# Patient Record
Sex: Female | Born: 1935 | Race: White | Hispanic: No | Smoking: Never smoker
Health system: Southern US, Community
[De-identification: ages and names within clinical notes are randomized; demographics above are authoritative.]

## PROBLEM LIST (undated history)

## (undated) DIAGNOSIS — Z9889 Other specified postprocedural states: Secondary | ICD-10-CM

## (undated) DIAGNOSIS — M810 Age-related osteoporosis without current pathological fracture: Secondary | ICD-10-CM

## (undated) DIAGNOSIS — E559 Vitamin D deficiency, unspecified: Secondary | ICD-10-CM

## (undated) HISTORY — PX: APPENDECTOMY: SHX54

---
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
## 2004-04-22 ENCOUNTER — Ambulatory Visit: Payer: Self-pay | Admitting: Unknown Physician Specialty

## 2005-04-29 ENCOUNTER — Ambulatory Visit: Payer: Self-pay | Admitting: Unknown Physician Specialty

## 2006-05-12 ENCOUNTER — Ambulatory Visit: Payer: Self-pay | Admitting: Unknown Physician Specialty

## 2006-09-26 ENCOUNTER — Ambulatory Visit: Payer: Self-pay | Admitting: Ophthalmology

## 2007-01-30 ENCOUNTER — Ambulatory Visit: Payer: Self-pay | Admitting: Ophthalmology

## 2007-04-12 ENCOUNTER — Ambulatory Visit: Payer: Self-pay | Admitting: Unknown Physician Specialty

## 2007-04-25 ENCOUNTER — Ambulatory Visit: Payer: Self-pay | Admitting: Ophthalmology

## 2007-05-19 ENCOUNTER — Ambulatory Visit: Payer: Self-pay | Admitting: Unknown Physician Specialty

## 2007-05-26 ENCOUNTER — Ambulatory Visit: Payer: Self-pay | Admitting: Unknown Physician Specialty

## 2007-11-30 ENCOUNTER — Ambulatory Visit: Payer: Self-pay | Admitting: Unknown Physician Specialty

## 2008-05-23 ENCOUNTER — Ambulatory Visit: Payer: Self-pay | Admitting: Unknown Physician Specialty

## 2009-10-21 ENCOUNTER — Ambulatory Visit: Payer: Self-pay | Admitting: Unknown Physician Specialty

## 2010-01-28 ENCOUNTER — Ambulatory Visit: Payer: Self-pay | Admitting: General Practice

## 2010-07-09 ENCOUNTER — Ambulatory Visit: Payer: Self-pay | Admitting: Unknown Physician Specialty

## 2011-01-18 ENCOUNTER — Ambulatory Visit: Payer: Self-pay | Admitting: Unknown Physician Specialty

## 2012-03-30 ENCOUNTER — Ambulatory Visit (INDEPENDENT_AMBULATORY_CARE_PROVIDER_SITE_OTHER): Payer: Medicare Other | Admitting: Internal Medicine

## 2012-03-30 ENCOUNTER — Encounter: Payer: Self-pay | Admitting: Internal Medicine

## 2012-03-30 VITALS — BP 118/78 | HR 69 | Temp 97.8°F | Resp 16 | Ht 65.0 in | Wt 106.8 lb

## 2012-03-30 DIAGNOSIS — R350 Frequency of micturition: Secondary | ICD-10-CM

## 2012-03-30 DIAGNOSIS — R413 Other amnesia: Secondary | ICD-10-CM

## 2012-03-30 DIAGNOSIS — Z13220 Encounter for screening for lipoid disorders: Secondary | ICD-10-CM

## 2012-03-30 DIAGNOSIS — R636 Underweight: Secondary | ICD-10-CM

## 2012-03-30 DIAGNOSIS — Z1239 Encounter for other screening for malignant neoplasm of breast: Secondary | ICD-10-CM

## 2012-03-30 DIAGNOSIS — M810 Age-related osteoporosis without current pathological fracture: Secondary | ICD-10-CM

## 2012-03-30 DIAGNOSIS — Z23 Encounter for immunization: Secondary | ICD-10-CM

## 2012-03-30 MED ORDER — PNEUMOCOCCAL VAC POLYVALENT 25 MCG/0.5ML IJ INJ: 0.5000 mL | INJECTION | Freq: Once | INTRAMUSCULAR | Status: AC

## 2012-03-30 MED ORDER — PNEUMOCOCCAL VAC POLYVALENT 25 MCG/0.5ML IJ INJ: 0.5000 mL | INJECTION | Freq: Once | INTRAMUSCULAR | Status: DC

## 2012-03-30 NOTE — Patient Instructions (Addendum)
You need 1800 mg of calcium daily ,  Through diet and supplements.   You need a minimum of 800 units of vit d . You are currently getting 1200 units which is fine,  We will check your vitamin d level with your fasting blood work.   I would like you to gain 10 lbs over the next year. This will strengthen your bones  You can do this by increasing the protein in your diet.   Eat more cheese and peanut butter!!  Try switching to [place] yogurt; it has more protein in it   We will schedule your mammogram today   Return in the Spring for your Annual wellness exam

## 2012-03-30 NOTE — Progress Notes (Signed)
Patient ID: Courtney Newton, female   DOB: 1927/02/05, 79 y.o.   MRN: [id number]  Patient Active Problem List  Diagnosis  . Underweight  . Osteoporosis    Subjective:  CC:   Chief Complaint  Patient presents with  . Establish Care    HPI:   Courtney Newton is a 79 y.o. female who presents as a new patient to establish primary care with the chief complaint of  Transferring care.  No specific complaints, but has urinary urgency for the past year or so. Some urge incontinence almost daily. Has not had urinary testing .  Prior PCP Bronsteit, and before him was treated by Francia Greaves for 30 years.  No constipation or nocturnal incontinence.  She has no history of pregnancies .  Lives alone.  Takes care of the pastor's dog occasionally. .  Previously had cats before moving to [place] in 2011  .  Very active socailly and in her church.  No balance issues.  No recent falls. Some short term memory loss more recently , names of acquaintenances getting harder to remember especially since she moved from her big house with 14 acres of  land  With 3 horses (gave up riding many years ago) to [place].   Grew up in [place],  travels to [place] to see sister.  Went to [place] for Christmas.  Widowed 14 years ago.    History reviewed. No pertinent past medical history.  History reviewed. No pertinent past surgical history.  No family history on file.  History   Social History  . Marital Status: Widowed    Spouse Name: N/A    Number of Children: N/A  . Years of Education: N/A   Occupational History  . Not on file.   Social History Main Topics  . Smoking status: Never Smoker   . Smokeless tobacco: Not on file  . Alcohol Use: Yes  . Drug Use: No  . Sexually Active: Not on file   Other Topics Concern  . Not on file   Social History Narrative  . No narrative on file        . pneumococcal 23 valent vaccine  0.5 mL Intramuscular Once  No Known Allergies  Review of  Systems:  Patient denies headache, fevers, malaise, unintentional weight loss, skin rash, eye pain, sinus congestion and sinus pain, sore throat, dysphagia,  hemoptysis , cough, dyspnea, wheezing, chest pain, palpitations, orthopnea, edema, abdominal pain, nausea, melena, diarrhea, constipation, flank pain, dysuria, hematuria, urinary  Frequency, nocturia, numbness, tingling, seizures,  Focal weakness, Loss of consciousness,  Tremor, insomnia, depression, anxiety, and suicidal ideation.     Objective:  BP 118/78  Pulse 69  Temp 97.8 F (36.6 C) (Oral)  Resp 16  Ht 5\' 5"  (1.651 m)  Wt 106 lb 12 oz (48.421 kg)  BMI 17.76 kg/m2  SpO2 97%  General appearance: alert, cooperative and appears stated age Ears: normal TM's and external ear canals both ears Throat: lips, mucosa, and tongue normal; teeth and gums normal Neck: no adenopathy, no carotid bruit, supple, symmetrical, trachea midline and thyroid not enlarged, symmetric, no tenderness/mass/nodules Back: symmetric, no curvature. ROM normal. No CVA tenderness. Lungs: clear to auscultation bilaterally Heart: regular rate and rhythm, S1, S2 normal, no murmur, click, rub or gallop Abdomen: soft, non-tender; bowel sounds normal; no masses,  no organomegaly Pulses: 2+ and symmetric Skin: Skin color, texture, turgor normal. No rashes or lesions Lymph nodes: Cervical, supraclavicular, and axillary nodes normal.  Assessment and  Plan:  Underweight Weight has been stable per patient, She will return for fasting lipids. We will also check thyroid screen for diabetes and check prealbumin stores. I recommended that she try gained 10 pounds this year. Calcium and vitamin D requirements also discussed  Osteoporosis She has no history of fractures and is not interested in therapy at this time. Continue calcium vitamin D and weightbearing exercise.  Memory loss, short term Noticed by patient over the last year since her move. We'll screen for  reversible causes have her return for Mini-Mental Status test.  Urinary frequency Chronic no signs of UTI by prior testing. Discussed trial of medications the risks of side effects. She's not interested in therapy at this time.   Updated Medication List Outpatient Encounter Prescriptions as of 03/30/2012  Medication Sig Dispense Refill  . Calcium Carbonate (CALCIUM 600 PO) Take 1 tablet by mouth daily.      . Multiple Vitamins-Minerals (MULTIVITAMIN PO) Take 1 tablet by mouth daily.       Facility-Administered Encounter Medications as of 03/30/2012  Medication Dose Route Frequency Provider Last Rate Last Dose  . pneumococcal 23 valent vaccine (PNU-IMMUNE) injection 0.5 mL  0.5 mL Intramuscular Once Sherlene Shams, MD      . [DISCONTINUED] pneumococcal 23 valent vaccine (PNU-IMMUNE) injection 0.5 mL  0.5 mL Intramuscular Once Sherlene Shams, MD

## 2012-04-01 ENCOUNTER — Encounter: Payer: Self-pay | Admitting: Internal Medicine

## 2012-04-01 DIAGNOSIS — R636 Underweight: Secondary | ICD-10-CM | POA: Insufficient documentation

## 2012-04-01 DIAGNOSIS — M810 Age-related osteoporosis without current pathological fracture: Secondary | ICD-10-CM | POA: Insufficient documentation

## 2012-04-01 DIAGNOSIS — R350 Frequency of micturition: Secondary | ICD-10-CM | POA: Insufficient documentation

## 2012-04-01 DIAGNOSIS — F0150 Vascular dementia without behavioral disturbance: Secondary | ICD-10-CM | POA: Insufficient documentation

## 2012-04-01 NOTE — Assessment & Plan Note (Signed)
Chronic no signs of UTI by prior testing. Discussed trial of medications the risks of side effects. She's not interested in therapy at this time.

## 2012-04-01 NOTE — Assessment & Plan Note (Signed)
Noticed by patient over the last year since her move. We'll screen for reversible causes have her return for Mini-Mental Status test.

## 2012-04-01 NOTE — Assessment & Plan Note (Signed)
Weight has been stable per patient, She will return for fasting lipids. We will also check thyroid screen for diabetes and check prealbumin stores. I recommended that she try gained 10 pounds this year. Calcium and vitamin D requirements also discussed

## 2012-04-01 NOTE — Assessment & Plan Note (Signed)
She has no history of fractures and is not interested in therapy at this time. Continue calcium vitamin D and weightbearing exercise.

## 2012-04-06 ENCOUNTER — Other Ambulatory Visit (INDEPENDENT_AMBULATORY_CARE_PROVIDER_SITE_OTHER): Payer: Medicare Other

## 2012-04-06 DIAGNOSIS — Z13220 Encounter for screening for lipoid disorders: Secondary | ICD-10-CM

## 2012-04-06 DIAGNOSIS — R636 Underweight: Secondary | ICD-10-CM

## 2012-04-06 LAB — LIPID PANEL
Cholesterol: 264 mg/dL — ABNORMAL HIGH (ref 0–200)
VLDL: 15.6 mg/dL (ref 0.0–40.0)

## 2012-04-06 LAB — COMPREHENSIVE METABOLIC PANEL
AST: 29 U/L (ref 0–37)
BUN: 24 mg/dL — ABNORMAL HIGH (ref 6–23)
Calcium: 9.4 mg/dL (ref 8.4–10.5)
Chloride: 104 mEq/L (ref 96–112)
Creatinine, Ser: 1 mg/dL (ref 0.4–1.2)

## 2012-04-07 LAB — VITAMIN D 25 HYDROXY (VIT D DEFICIENCY, FRACTURES): Vit D, 25-Hydroxy: 52 ng/mL (ref 30–89)

## 2012-05-01 ENCOUNTER — Ambulatory Visit: Payer: Self-pay | Admitting: Internal Medicine

## 2012-05-06 ENCOUNTER — Encounter: Payer: Self-pay | Admitting: Internal Medicine

## 2012-05-06 DIAGNOSIS — M810 Age-related osteoporosis without current pathological fracture: Secondary | ICD-10-CM

## 2012-05-16 ENCOUNTER — Encounter: Payer: Self-pay | Admitting: Internal Medicine

## 2012-07-17 ENCOUNTER — Encounter: Payer: Self-pay | Admitting: Internal Medicine

## 2012-07-17 ENCOUNTER — Ambulatory Visit (INDEPENDENT_AMBULATORY_CARE_PROVIDER_SITE_OTHER): Payer: Medicare Other | Admitting: Internal Medicine

## 2012-07-17 VITALS — BP 136/62 | HR 74 | Temp 97.7°F | Resp 14 | Ht 63.5 in | Wt 104.8 lb

## 2012-07-17 DIAGNOSIS — Z1211 Encounter for screening for malignant neoplasm of colon: Secondary | ICD-10-CM

## 2012-07-17 DIAGNOSIS — R413 Other amnesia: Secondary | ICD-10-CM

## 2012-07-17 DIAGNOSIS — Z01419 Encounter for gynecological examination (general) (routine) without abnormal findings: Secondary | ICD-10-CM

## 2012-07-17 DIAGNOSIS — Z0000 Encounter for general adult medical examination without abnormal findings: Secondary | ICD-10-CM

## 2012-07-17 MED ORDER — TETANUS-DIPHTH-ACELL PERTUSSIS 5-2.5-18.5 LF-MCG/0.5 IM SUSP: 0.5000 mL | Freq: Once | INTRAMUSCULAR | Status: DC

## 2012-07-17 MED ORDER — ZOSTER VACCINE LIVE 19400 UNT/0.65ML ~~LOC~~ SOLR: 0.6500 mL | Freq: Once | SUBCUTANEOUS | Status: DC

## 2012-07-17 NOTE — Patient Instructions (Addendum)
I recommend that you get a TDaP vaccine and a Shingles vaccine   You can return the home stool test for blood  I will see you again in 6 months

## 2012-07-17 NOTE — Assessment & Plan Note (Signed)
Annual comprehensive exam was done including breast and pelvic exam. All screenings have been addressed .  

## 2012-07-17 NOTE — Progress Notes (Signed)
Patient ID: Courtney Newton, female   DOB: 1926/04/27, 79 y.o.   MRN: [id number]  MMSE:  3/3   The patient is here for annual Medicare wellness examination and management of other chronic and acute problems. Post prandial bowel movements  Several per dat ,  Has been going on for several months. No blood or cramping.  Husband died from colon Ca.  Her last colonoscopy was 2012 and she is due again in 2017.    The risk factors are reflected in the social history.  The roster of all physicians providing medical care to patient - is listed in the Snapshot section of the chart.  Activities of daily living:  The patient is 100% independent in all ADLs: dressing, toileting, feeding as well as independent mobility  Home safety : The patient has smoke detectors in the home. They wear seatbelts.  There are no firearms at home. There is no violence in the home.   There is no risks for hepatitis, STDs or HIV. There is no   history of blood transfusion. They have no travel history to infectious disease endemic areas of the world.  The patient has seen their dentist in the last six month. They have seen their eye doctor in the last year. They admit to slight hearing difficulty with regard to whispered voices and some television programs.  They have deferred audiologic testing in the last year.  They do not  have excessive sun exposure. Discussed the need for sun protection: hats, long sleeves and use of sunscreen if there is significant sun exposure.   Diet: the importance of a healthy diet is discussed. They do have a healthy diet.  The benefits of regular aerobic exercise were discussed. She walks 4 times per week ,  20 minutes.   Depression screen: there are no signs or vegative symptoms of depression- irritability, change in appetite, anhedonia, sadness/tearfullness.  Cognitive assessment: the patient manages all their financial and personal affairs and is actively engaged. They could relate day,date,year  and events; recalled 2/3 objects at 3 minutes; performed clock-face test normally.  The following portions of the patient's history were reviewed and updated as appropriate: allergies, current medications, past family history, past medical history,  past surgical history, past social history  and problem list.  Visual acuity was not assessed per patient preference since she has regular follow up with her ophthalmologist. Hearing and body mass index were assessed and reviewed.   During the course of the visit the patient was educated and counseled about appropriate screening and preventive services including : fall prevention , diabetes screening, nutrition counseling, colorectal cancer screening, and recommended immunizations.    Objective:  BP 136/62  Pulse 74  Temp(Src) 97.7 F (36.5 C) (Oral)  Resp 14  Ht 5' 3.5" (1.613 m)  Wt 104 lb 12 oz (47.514 kg)  BMI 18.26 kg/m2  SpO2 98%  General Appearance:    Alert, cooperative, no distress, appears stated age  Head:    Normocephalic, without obvious abnormality, atraumatic  Eyes:    PERRL, conjunctiva/corneas clear, EOM's intact, fundi    benign, both eyes  Ears:    Normal TM's and external ear canals, both ears  Nose:   Nares normal, septum midline, mucosa normal, no drainage    or sinus tenderness  Throat:   Lips, mucosa, and tongue normal; teeth and gums normal  Neck:   Supple, symmetrical, trachea midline, no adenopathy;    thyroid:  no enlargement/tenderness/nodules; no carotid  bruit or JVD  Back:     Symmetric, no curvature, ROM normal, no CVA tenderness  Lungs:     Clear to auscultation bilaterally, respirations unlabored  Chest Wall:    No tenderness or deformity   Heart:    Regular rate and rhythm, S1 and S2 normal, no murmur, rub   or gallop  Breast Exam:    No tenderness, masses, or nipple abnormality  Abdomen:     Soft, non-tender, bowel sounds active all four quadrants,    no masses, no organomegaly  Genitalia:     Pelvic: cervix absent, external genitalia normal, no adnexal masses or tenderness,  Uterus absent   Extremities:   Extremities normal, atraumatic, no cyanosis or edema  Pulses:   2+ and symmetric all extremities  Skin:   Skin color, texture, turgor normal, no rashes or lesions  Lymph nodes:   Cervical, supraclavicular, and axillary nodes normal  Neurologic:   CNII-XII intact, normal strength, sensation and reflexes    throughout    Assessment and Plan  Routine general medical examination at a health care facility Annual comprehensive exam was done including breast and pelvic exam .  All screenings have been addressed .   Memory loss, short term She has no signs of  demetia or memory loss on exam. Her MMSE  Was 29/30, clock drawing was normal and fluency was normal.   Updated Medication List Outpatient Encounter Prescriptions as of 07/17/2012  Medication Sig Dispense Refill  . Calcium Carbonate (CALCIUM 600 PO) Take 1 tablet by mouth daily.      . Multiple Vitamins-Minerals (MULTIVITAMIN PO) Take 1 tablet by mouth daily.      . TDaP (BOOSTRIX) 5-2.5-18.5 LF-MCG/0.5 injection Inject 0.5 mLs into the muscle once.  0.5 mL  0  . zoster vaccine live, PF, (ZOSTAVAX) 84132 UNT/0.65ML injection Inject 19,400 Units into the skin once.  1 each  0   Facility-Administered Encounter Medications as of 07/17/2012  Medication Dose Route Frequency Provider Last Rate Last Dose  . pneumococcal 23 valent vaccine (PNU-IMMUNE) injection 0.5 mL  0.5 mL Intramuscular Once Sherlene Shams, MD

## 2012-07-17 NOTE — Assessment & Plan Note (Signed)
She has no signs of  demetia or memory loss on exam. Her MMSE  Was 29/30, clock drawing was normal and fluency was normal.

## 2012-07-24 ENCOUNTER — Encounter: Payer: Self-pay | Admitting: Internal Medicine

## 2012-07-24 ENCOUNTER — Telehealth: Payer: Self-pay | Admitting: *Deleted

## 2012-07-24 ENCOUNTER — Other Ambulatory Visit: Payer: Medicare Other

## 2012-07-24 DIAGNOSIS — Z1211 Encounter for screening for malignant neoplasm of colon: Secondary | ICD-10-CM

## 2012-07-24 NOTE — Telephone Encounter (Signed)
Orders for iFOB

## 2012-07-25 ENCOUNTER — Encounter: Payer: Self-pay | Admitting: Internal Medicine

## 2012-07-25 ENCOUNTER — Other Ambulatory Visit (INDEPENDENT_AMBULATORY_CARE_PROVIDER_SITE_OTHER): Payer: Medicare Other

## 2012-07-25 ENCOUNTER — Other Ambulatory Visit: Payer: Self-pay | Admitting: *Deleted

## 2012-07-25 DIAGNOSIS — Z1211 Encounter for screening for malignant neoplasm of colon: Secondary | ICD-10-CM

## 2012-08-01 ENCOUNTER — Encounter: Payer: Self-pay | Admitting: Internal Medicine

## 2013-01-03 ENCOUNTER — Ambulatory Visit (INDEPENDENT_AMBULATORY_CARE_PROVIDER_SITE_OTHER): Payer: Medicare Other | Admitting: Internal Medicine

## 2013-01-03 VITALS — BP 126/60 | HR 88 | Temp 97.9°F | Resp 12 | Wt 102.0 lb

## 2013-01-03 DIAGNOSIS — R636 Underweight: Secondary | ICD-10-CM

## 2013-01-03 DIAGNOSIS — Z8619 Personal history of other infectious and parasitic diseases: Secondary | ICD-10-CM | POA: Insufficient documentation

## 2013-01-03 DIAGNOSIS — M810 Age-related osteoporosis without current pathological fracture: Secondary | ICD-10-CM

## 2013-01-03 DIAGNOSIS — B029 Zoster without complications: Secondary | ICD-10-CM

## 2013-01-03 MED ORDER — ACYCLOVIR 400 MG PO TABS: 400.0000 mg | ORAL_TABLET | ORAL | Status: DC

## 2013-01-03 NOTE — Assessment & Plan Note (Addendum)
Suspected,  Given history of papular annular rash on left side of chin present for a week,  No improvement with topical antimicrobial ointment not particular painful,  But has has had the vaccine. Acyclovir given. Stop the ointment.

## 2013-01-03 NOTE — Progress Notes (Signed)
Patient ID: Courtney Newton, female   DOB: Aug 30, 1926, 79 y.o.   MRN: [id number]   Patient Active Problem List   Diagnosis Date Noted  . Shingles outbreak 01/03/2013  . Routine general medical examination at a health care facility 07/17/2012  . Underweight 04/01/2012  . Osteoporosis 04/01/2012  . Memory loss, short term 04/01/2012  . Urinary frequency 04/01/2012    Subjective:  CC:   Chief Complaint  Patient presents with  . rash on chin    x 1 week    HPI:   Courtney Newton a 79 y.o. female who presents blistering annular rash on left chin came up a week ago.  He has had herpes involving the lower lip before and thought that was the case.  Headache noted but mild .,  has been using polysporin  ointment on rashwith no improvement .  The papules reportedly scab over which causes the area to feel tight.  head feels fuzzy,  No recent  travel , no fevers,  No exposure to kids, no outside yard work, but has a dog who she allows to lick her face occasionally   No past medical history on file.  Past Surgical History  Procedure Laterality Date  . Bilateral oophorectomy  1950  . Abdominal hysterectomy  1950    . pneumococcal 23 valent vaccine  0.5 mL Intramuscular Once     The following portions of the patient's history were reviewed and updated as appropriate: Allergies, current medications, and problem list.    Review of Systems:   12 Pt  review of systems was negative except those addressed in the HPI,     History   Social History  . Marital Status: Widowed    Spouse Name: N/A    Number of Children: N/A  . Years of Education: N/A   Occupational History  . Not on file.   Social History Main Topics  . Smoking status: Never Smoker   . Smokeless tobacco: Not on file  . Alcohol Use: Yes  . Drug Use: No  . Sexual Activity: Not on file   Other Topics Concern  . Not on file   Social History Narrative  . No narrative on file    Objective:  Filed Vitals:    01/03/13 1014  BP: 126/60  Pulse: 88  Temp: 97.9 F (36.6 C)  Resp: 12     General appearance: alert, cooperative and appears stated age Ears: normal TM's and external ear canals both ears Throat: lips, mucosa, and tongue normal; teeth and gums normal Neck: no adenopathy, no carotid bruit, supple, symmetrical, trachea midline and thyroid not enlarged, symmetric, no tenderness/mass/nodules Back: symmetric, no curvature. ROM normal. No CVA tenderness. Lungs: clear to auscultation bilaterally Heart: regular rate and rhythm, S1, S2 normal, no murmur, click, rub or gallop Abdomen: soft, non-tender; bowel sounds normal; no masses,  no organomegaly Pulses: 2+ and symmetric Skin: Skin color, texture, turgor normal. No rashes or lesions Lymph nodes: Cervical, supraclavicular, and axillary nodes normal.  Assessment and Plan:  Shingles outbreak Suspected,  Given history of papular annular rash on left side of chin present for a week,  No improvement with topical antimicrobial ointment not particular painful,  But has has had the vaccine. Acyclovir given. Stop the ointment.   Underweight , inability to gain weight.  wt is stable. Prealbumin was normal at 25.  Reviewed diet, suggestions made.   Osteoporosis Vit d level is normal,  Continue supplements   Updated Medication List Outpatient  Encounter Prescriptions as of 01/03/2013  Medication Sig Dispense Refill  . Calcium Carbonate (CALCIUM 600 PO) Take 1 tablet by mouth daily.      . Multiple Vitamins-Minerals (MULTIVITAMIN PO) Take 1 tablet by mouth daily.      Marland Kitchen acyclovir (ZOVIRAX) 400 MG tablet Take 1 tablet (400 mg total) by mouth every 4 (four) hours while awake.  28 tablet  0  . [DISCONTINUED] TDaP (BOOSTRIX) 5-2.5-18.5 LF-MCG/0.5 injection Inject 0.5 mLs into the muscle once.  0.5 mL  0  . [DISCONTINUED] zoster vaccine live, PF, (ZOSTAVAX) 16109 UNT/0.65ML injection Inject 19,400 Units into the skin once.  1 each  0    Facility-Administered Encounter Medications as of 01/03/2013  Medication Dose Route Frequency Provider Last Rate Last Dose  . pneumococcal 23 valent vaccine (PNU-IMMUNE) injection 0.5 mL  0.5 mL Intramuscular Once Sherlene Shams, MD

## 2013-01-03 NOTE — Patient Instructions (Signed)
I am treating you for a shingles outbreak.,  Please start the antiviral medication acyclovir as soon as possible.  Try to get 4 or 5 doses in per day for a week.  You can use advil or tylenol for the discomfort  If the rash gets worse or spreads to the other side of your face,  Call me and I will add an antibiotic  ------------------------------------------------------------------------------  You have lost 2 lbs!!  Please try to eat high calorie snacks between meals  Try eating a stick of cheese or an ounce of nuts twice daily between your meals

## 2013-01-05 ENCOUNTER — Encounter: Payer: Self-pay | Admitting: Internal Medicine

## 2013-01-05 NOTE — Assessment & Plan Note (Signed)
Vit d level is normal,  Continue supplements

## 2013-01-05 NOTE — Assessment & Plan Note (Addendum)
,   inability to gain weight.  wt is stable. Prealbumin was normal at 25.  Reviewed diet, suggestions made.

## 2013-05-02 ENCOUNTER — Ambulatory Visit: Payer: Self-pay | Admitting: Internal Medicine

## 2013-06-05 ENCOUNTER — Encounter: Payer: Self-pay | Admitting: Internal Medicine

## 2013-07-18 ENCOUNTER — Encounter: Payer: Self-pay | Admitting: Adult Health

## 2013-07-18 ENCOUNTER — Telehealth: Payer: Self-pay | Admitting: Internal Medicine

## 2013-07-18 ENCOUNTER — Ambulatory Visit (INDEPENDENT_AMBULATORY_CARE_PROVIDER_SITE_OTHER): Payer: Commercial Managed Care - HMO | Admitting: Adult Health

## 2013-07-18 VITALS — BP 124/64 | HR 70 | Temp 97.6°F | Resp 12 | Wt 105.2 lb

## 2013-07-18 DIAGNOSIS — R1903 Right lower quadrant abdominal swelling, mass and lump: Secondary | ICD-10-CM

## 2013-07-18 DIAGNOSIS — R1909 Other intra-abdominal and pelvic swelling, mass and lump: Secondary | ICD-10-CM

## 2013-07-18 NOTE — Progress Notes (Signed)
Pre visit review using our clinic review tool, if applicable. No additional management support is needed unless otherwise documented below in the visit note. 

## 2013-07-18 NOTE — Telephone Encounter (Signed)
FYI

## 2013-07-18 NOTE — Progress Notes (Signed)
Patient ID: Courtney Newton, female   DOB: 1927/02/03, 80 y.o.   MRN: [id number]    Subjective:    Patient ID: Courtney Newton, female    DOB: 1927/02/03, 80 y.o.   MRN: [id number]  HPI  Pt is a very pleasant 80 year old female presents to clinic with concerns of a mass on her right groin. She first noticed it ~ 9 months ago. She reports that this mass fluctuates in size. Denies any pain. She reports that it will sometimes get firm. At first she ignored this but has recently thought she needs to have this checked. She is s/p abdominal hysterectomy and bil oophorectomy (1950). Denies fever or any other symptom associated with the mass.    History reviewed. No pertinent past medical history.   Past Surgical History  Procedure Laterality Date  . Bilateral oophorectomy  1950  . Abdominal hysterectomy  1950     No family history on file.   History   Social History  . Marital Status: Widowed    Spouse Name: N/A    Number of Children: N/A  . Years of Education: N/A   Occupational History  . Not on file.   Social History Main Topics  . Smoking status: Never Smoker   . Smokeless tobacco: Not on file  . Alcohol Use: Yes  . Drug Use: No  . Sexual Activity: Not on file   Other Topics Concern  . Not on file   Social History Narrative  . No narrative on file     Current Outpatient Prescriptions on File Prior to Visit  Medication Sig Dispense Refill  . Calcium Carbonate (CALCIUM 600 PO) Take 1 tablet by mouth daily.      . Multiple Vitamins-Minerals (MULTIVITAMIN PO) Take 1 tablet by mouth daily.       Current Facility-Administered Medications on File Prior to Visit  Medication Dose Route Frequency Provider Last Rate Last Dose  . pneumococcal 23 valent vaccine (PNU-IMMUNE) injection 0.5 mL  0.5 mL Intramuscular Once Sherlene Shamseresa L Tullo, MD         Review of Systems Positive for mass on right groin. Fluctuates in size. Negative for pain, fever, malaise All other systems  negative    Objective:  BP 124/64  Pulse 70  Temp(Src) 97.6 F (36.4 C) (Oral)  Resp 12  Wt 105 lb 4 oz (47.741 kg)  SpO2 97%   Physical Exam  Constitutional: She is oriented to person, place, and time. No distress.  HENT:  Head: Normocephalic and atraumatic.  Eyes: Conjunctivae and EOM are normal.  Neck: Normal range of motion. Neck supple.  Cardiovascular: Normal rate and regular rhythm.   Excellent right femoral pulse  Pulmonary/Chest: Effort normal. No respiratory distress.  Musculoskeletal: Normal range of motion.  Neurological: She is alert and oriented to person, place, and time. She has normal reflexes. Coordination normal.  Skin: Skin is warm and dry.  Psychiatric: She has a normal mood and affect. Her behavior is normal. Judgment and thought content normal.  Right groin mass ~ 3 cm, movable, firm       Assessment & Plan:   1. Right groin mass Suspect lymph node. Refer to general surgery for evaluation; possible bx - Ambulatory referral to General Surgery

## 2013-07-18 NOTE — Telephone Encounter (Signed)
Pt came in and wanted raquel to know she went to see dr cooper @ [facility] Excell Seltzercooper took care of the problem in the office today Pt stated it took about 10 min

## 2013-08-09 ENCOUNTER — Encounter: Payer: Self-pay | Admitting: Adult Health

## 2013-08-09 ENCOUNTER — Ambulatory Visit (INDEPENDENT_AMBULATORY_CARE_PROVIDER_SITE_OTHER): Payer: Commercial Managed Care - HMO | Admitting: Adult Health

## 2013-08-09 VITALS — BP 164/74 | HR 63 | Temp 97.5°F | Resp 14 | Ht 63.5 in | Wt 106.5 lb

## 2013-08-09 DIAGNOSIS — M542 Cervicalgia: Secondary | ICD-10-CM | POA: Insufficient documentation

## 2013-08-09 MED ORDER — METHOCARBAMOL 500 MG PO TABS: 500.0000 mg | ORAL_TABLET | Freq: Three times a day (TID) | ORAL | Status: DC | PRN

## 2013-08-09 NOTE — Progress Notes (Signed)
Pre visit review using our clinic review tool, if applicable. No additional management support is needed unless otherwise documented below in the visit note. 

## 2013-08-09 NOTE — Progress Notes (Signed)
Patient ID: Courtney Newton, female   DOB: 1927-03-12, 80 y.o.   MRN: [id number]    Subjective:    Patient ID: Courtney Newton, female    DOB: 1927-03-12, 80 y.o.   MRN: [id number]  HPI  Pt is a pleasant 80 y/o female who presents with right posterior neck pain ongoing x 1 week. She reports waking up with the pain. Improved when she first gets up in the morning then slowly stiffens. Has been taking Aleve and tylenol. Pain has not improved.   Current Outpatient Prescriptions on File Prior to Visit  Medication Sig Dispense Refill  . Calcium Carbonate (CALCIUM 600 PO) Take 1 tablet by mouth daily.      . Multiple Vitamins-Minerals (MULTIVITAMIN PO) Take 1 tablet by mouth daily.       Current Facility-Administered Medications on File Prior to Visit  Medication Dose Route Frequency Provider Last Rate Last Dose  . pneumococcal 23 valent vaccine (PNU-IMMUNE) injection 0.5 mL  0.5 mL Intramuscular Once Sherlene Shamseresa L Tullo, MD         Review of Systems  Musculoskeletal: Positive for neck pain (stiffness).       Objective:  BP 164/74  Pulse 63  Temp(Src) 97.5 F (36.4 C) (Oral)  Resp 14  Ht 5' 3.5" (1.613 m)  Wt 106 lb 8 oz (48.308 kg)  BMI 18.57 kg/m2  SpO2 97%   Physical Exam  Constitutional: She is oriented to person, place, and time. No distress.  HENT:  Head: Normocephalic and atraumatic.  Eyes: Conjunctivae and EOM are normal.  Neck: Normal range of motion. Neck supple.  Cardiovascular: Normal rate and regular rhythm.   Pulmonary/Chest: Effort normal. No respiratory distress.  Musculoskeletal: She exhibits tenderness (palpation over trapezius muscle on the right ). She exhibits no edema.  Decreased ROM. Unable to turn head toward the right side. Increased mobility toward the left.  Neurological: She is alert and oriented to person, place, and time. She has normal reflexes. Coordination normal.  Skin: Skin is warm and dry.  Psychiatric: She has a normal mood and affect. Her behavior  is normal. Judgment and thought content normal.      Assessment & Plan:   1. Neck pain on right side Robaxin, stretching exercises, neck support pillow, ice alternating with heat, aleve x 4 more days. RTC if no improvement.

## 2013-08-09 NOTE — Patient Instructions (Signed)
   Take Aleve twice a day for the next 5 days. Take with food.  Also take robaxin for muscle spasms. This will make you sleepy. Do not drive when taking this medication.  Apply ice alternating with heat to the affected areas for 15 min at a time. Do this approximately 3-4 times daily.  Stretching exercises for your neck as we discussed.  Use a good neck support pillow.

## 2013-08-15 ENCOUNTER — Encounter: Payer: Self-pay | Admitting: Internal Medicine

## 2013-08-15 ENCOUNTER — Other Ambulatory Visit: Payer: Self-pay | Admitting: Adult Health

## 2013-08-15 DIAGNOSIS — M542 Cervicalgia: Secondary | ICD-10-CM

## 2013-08-15 NOTE — Telephone Encounter (Signed)
Forwarded other [employer] to Raquel, see other encounter

## 2013-08-16 ENCOUNTER — Encounter: Payer: Self-pay | Admitting: Adult Health

## 2013-08-16 ENCOUNTER — Ambulatory Visit
Admission: RE | Admit: 2013-08-16 | Discharge: 2013-08-16 | Disposition: A | Discharge status: Home / Self Care | Payer: Commercial Managed Care - HMO | Source: Ambulatory Visit | Attending: Adult Health | Admitting: Adult Health

## 2013-08-16 ENCOUNTER — Ambulatory Visit (INDEPENDENT_AMBULATORY_CARE_PROVIDER_SITE_OTHER)
Admission: RE | Admit: 2013-08-16 | Discharge: 2013-08-16 | Disposition: A | Discharge status: Home / Self Care | Payer: Commercial Managed Care - HMO | Source: Ambulatory Visit | Attending: Adult Health | Admitting: Adult Health

## 2013-08-16 ENCOUNTER — Other Ambulatory Visit: Payer: Self-pay | Admitting: Adult Health

## 2013-08-16 DIAGNOSIS — M542 Cervicalgia: Secondary | ICD-10-CM

## 2013-11-06 ENCOUNTER — Ambulatory Visit: Payer: Commercial Managed Care - HMO

## 2013-11-08 ENCOUNTER — Ambulatory Visit: Payer: Commercial Managed Care - HMO

## 2013-12-15 ENCOUNTER — Ambulatory Visit: Payer: Commercial Managed Care - HMO

## 2013-12-15 ENCOUNTER — Encounter: Payer: Self-pay | Admitting: Internal Medicine

## 2013-12-15 ENCOUNTER — Ambulatory Visit (INDEPENDENT_AMBULATORY_CARE_PROVIDER_SITE_OTHER): Payer: Commercial Managed Care - HMO | Admitting: Internal Medicine

## 2013-12-15 VITALS — BP 130/78 | HR 83 | Temp 97.6°F

## 2013-12-15 DIAGNOSIS — L0100 Impetigo, unspecified: Secondary | ICD-10-CM

## 2013-12-15 MED ORDER — CEPHALEXIN 500 MG PO CAPS: 500.0000 mg | ORAL_CAPSULE | Freq: Two times a day (BID) | ORAL | Status: DC

## 2013-12-15 NOTE — Assessment & Plan Note (Signed)
Rash has appearance most consistent with impetigo. Given extent of rash, will start oral antibiotics with Keflex  po bid (renal dose). Follow up for recheck on Monday or sooner if needed. Use Benadryl prn itching.

## 2013-12-15 NOTE — Patient Instructions (Addendum)
Start Keflex  twice daily to help treat skin infection.  Start Benadryl  up to every 6 hours as needed for itching.  Follow up on Monday at 1pm

## 2013-12-15 NOTE — Progress Notes (Signed)
   Subjective:    Patient ID: Courtney Newton, female    DOB: 11-13-1926, 80 y.o.   MRN: [id number]  HPI 80YO female presents as a walk-in acute visit with rash.  Developed rash over face and neck yesterday. Rash is red. No new medications, lotions. She has been taking care of a new dog about 5 days ago. Rash is red and itchy. No fever, however rash felt warm.   Review of Systems  Constitutional: Negative for fever, chills, appetite change, fatigue and unexpected weight change.  Eyes: Negative for visual disturbance.  Respiratory: Negative for shortness of breath.   Cardiovascular: Negative for chest pain and leg swelling.  Gastrointestinal: Negative for abdominal pain.  Skin: Positive for color change and rash.  Hematological: Negative for adenopathy. Does not bruise/bleed easily.  Psychiatric/Behavioral: Negative for dysphoric mood. The patient is not nervous/anxious.        Objective:    BP 130/78  Pulse 83  Temp(Src) 97.6 F (36.4 C)  SpO2 94% Physical Exam  Constitutional: She is oriented to person, place, and time. She appears well-developed and well-nourished. No distress.  HENT:  Head: Normocephalic and atraumatic.  Right Ear: External ear normal.  Left Ear: External ear normal.  Nose: Nose normal.  Mouth/Throat: Oropharynx is clear and moist.  Eyes: Conjunctivae are normal. Pupils are equal, round, and reactive to light. Right eye exhibits no discharge. Left eye exhibits no discharge. No scleral icterus.  Neck: Normal range of motion. Neck supple. No tracheal deviation present. No thyromegaly present.  Cardiovascular: Normal rate, regular rhythm, normal heart sounds and intact distal pulses.  Exam reveals no gallop and no friction rub.   No murmur heard. Pulmonary/Chest: Effort normal and breath sounds normal. No accessory muscle usage. Not tachypneic. No respiratory distress. She has no decreased breath sounds. She has no wheezes. She has no rhonchi. She has no  rales. She exhibits no tenderness.  Musculoskeletal: Normal range of motion. She exhibits no edema and no tenderness.  Lymphadenopathy:    She has no cervical adenopathy.  Neurological: She is alert and oriented to person, place, and time. No cranial nerve deficit. She exhibits normal muscle tone. Coordination normal.  Skin: Skin is warm and dry. Rash noted. Rash is pustular. She is not diaphoretic. There is erythema. No pallor.     Psychiatric: She has a normal mood and affect. Her behavior is normal. Judgment and thought content normal.          Assessment & Plan:   Problem List Items Addressed This Visit     Unprioritized   Impetigo - Primary     Rash has appearance most consistent with impetigo. Given extent of rash, will start oral antibiotics with Keflex  po bid (renal dose). Follow up for recheck on Monday or sooner if needed. Use Benadryl prn itching.     Relevant Medications      cephALEXin (KEFLEX) capsule       Return in about 3 days (around 12/18/2013).

## 2013-12-17 ENCOUNTER — Encounter: Payer: Self-pay | Admitting: Internal Medicine

## 2013-12-17 ENCOUNTER — Ambulatory Visit (INDEPENDENT_AMBULATORY_CARE_PROVIDER_SITE_OTHER): Payer: Commercial Managed Care - HMO | Admitting: Internal Medicine

## 2013-12-17 VITALS — BP 140/70 | HR 81 | Temp 98.2°F | Ht 63.5 in | Wt 100.8 lb

## 2013-12-17 DIAGNOSIS — L309 Dermatitis, unspecified: Secondary | ICD-10-CM | POA: Insufficient documentation

## 2013-12-17 DIAGNOSIS — L0100 Impetigo, unspecified: Secondary | ICD-10-CM

## 2013-12-17 DIAGNOSIS — L259 Unspecified contact dermatitis, unspecified cause: Secondary | ICD-10-CM

## 2013-12-17 MED ORDER — PREDNISONE 10 MG PO TABS: ORAL_TABLET | ORAL | Status: DC

## 2013-12-17 NOTE — Patient Instructions (Signed)
Start Prednisone taper to help with rash.  Continue Keflex.  We will set up a dermatology evaluation.  Follow up on Friday.

## 2013-12-17 NOTE — Progress Notes (Signed)
   Subjective:    Patient ID: Courtney Newton, female    DOB: 09/19/1926, 80 y.o.   MRN: [id number]  HPI 80YO female presents for follow up. Seen in clinic on Saturday as a walk-in with rash over her face and neck. Diagnosed with impetigo. Treated with Keflex. After two doses of Keflex on Saturday, symptoms of rash seemed to be improving, however then yesterday and today, rash became more red and itchy. Taking Benadryl with no improvement.  Review of Systems  Constitutional: Negative for fever, chills, appetite change, fatigue and unexpected weight change.  Eyes: Negative for visual disturbance.  Respiratory: Negative for shortness of breath.   Cardiovascular: Negative for chest pain and leg swelling.  Gastrointestinal: Negative for abdominal pain.  Skin: Positive for color change and rash.  Hematological: Negative for adenopathy. Does not bruise/bleed easily.  Psychiatric/Behavioral: Negative for dysphoric mood. The patient is not nervous/anxious.        Objective:    BP 140/70  Pulse 81  Temp(Src) 98.2 F (36.8 C) (Oral)  Ht 5' 3.5" (1.613 m)  Wt 100 lb 12 oz (45.7 kg)  BMI 17.56 kg/m2  SpO2 92% Physical Exam  Constitutional: She is oriented to person, place, and time. She appears well-developed and well-nourished. No distress.  HENT:  Head: Normocephalic and atraumatic.    Right Ear: External ear normal.  Left Ear: External ear normal.  Nose: Nose normal.  Mouth/Throat: Oropharynx is clear and moist.  Eyes: Conjunctivae are normal. Pupils are equal, round, and reactive to light. Right eye exhibits no discharge. Left eye exhibits no discharge. No scleral icterus.  Neck: Normal range of motion. Neck supple. No tracheal deviation present. No thyromegaly present.  Pulmonary/Chest: Effort normal. No accessory muscle usage. Not tachypneic. She has no decreased breath sounds. She has no rhonchi.  Musculoskeletal: Normal range of motion. She exhibits no edema and no tenderness.    Lymphadenopathy:    She has no cervical adenopathy.  Neurological: She is alert and oriented to person, place, and time. No cranial nerve deficit. She exhibits normal muscle tone. Coordination normal.  Skin: Skin is warm and dry. Rash noted. Rash is maculopapular. She is not diaphoretic. No erythema. No pallor.  Psychiatric: She has a normal mood and affect. Her behavior is normal. Judgment and thought content normal.          Assessment & Plan:   Problem List Items Addressed This Visit     Unprioritized   Dermatitis - Primary     Rash initially appeared most consistent with impetigo. Crusted lesions have cleared and rash has appearance now more consistent with allergic dermatitis. Will set up dermatology evaluation today if possible. Will start Prednisone taper. Follow up on Friday or sooner as needed.    Relevant Medications      predniSONE (DELTASONE) tablet   Other Relevant Orders      Ambulatory referral to Dermatology   Impetigo       Return in about 4 days (around 12/21/2013).

## 2013-12-17 NOTE — Progress Notes (Signed)
Pre visit review using our clinic review tool, if applicable. No additional management support is needed unless otherwise documented below in the visit note. 

## 2013-12-17 NOTE — Assessment & Plan Note (Addendum)
Rash initially appeared most consistent with impetigo. Crusted lesions have cleared and rash has appearance now more consistent with allergic dermatitis. Will set up dermatology evaluation today if possible. Will start Prednisone taper. Follow up on Friday or sooner as needed.

## 2013-12-21 ENCOUNTER — Encounter: Payer: Self-pay | Admitting: Internal Medicine

## 2013-12-21 ENCOUNTER — Ambulatory Visit (INDEPENDENT_AMBULATORY_CARE_PROVIDER_SITE_OTHER): Payer: Commercial Managed Care - HMO | Admitting: Internal Medicine

## 2013-12-21 VITALS — BP 140/74 | HR 71 | Temp 98.2°F | Resp 12 | Ht 63.5 in | Wt 102.5 lb

## 2013-12-21 DIAGNOSIS — E785 Hyperlipidemia, unspecified: Secondary | ICD-10-CM

## 2013-12-21 DIAGNOSIS — R5383 Other fatigue: Secondary | ICD-10-CM

## 2013-12-21 DIAGNOSIS — S41102A Unspecified open wound of left upper arm, initial encounter: Secondary | ICD-10-CM

## 2013-12-21 DIAGNOSIS — E559 Vitamin D deficiency, unspecified: Secondary | ICD-10-CM

## 2013-12-21 DIAGNOSIS — M810 Age-related osteoporosis without current pathological fracture: Secondary | ICD-10-CM

## 2013-12-21 DIAGNOSIS — L309 Dermatitis, unspecified: Secondary | ICD-10-CM

## 2013-12-21 DIAGNOSIS — S41112A Laceration without foreign body of left upper arm, initial encounter: Secondary | ICD-10-CM

## 2013-12-21 DIAGNOSIS — L0100 Impetigo, unspecified: Secondary | ICD-10-CM

## 2013-12-21 NOTE — Progress Notes (Signed)
Patient ID: Courtney Newton, female   DOB: October 09, 1926, 80 y.o.   MRN: [id number]   Patient Active Problem List   Diagnosis Date Noted  . Skin tear of left upper arm without complication 12/23/2013  . Dermatitis 12/17/2013  . Impetigo 12/15/2013  . Neck pain on right side 08/09/2013  . Shingles outbreak 01/03/2013  . Routine general medical examination at a health care facility 07/17/2012  . Underweight 04/01/2012  . Osteoporosis 04/01/2012  . Memory loss, short term 04/01/2012  . Urinary frequency 04/01/2012    Subjective:  CC:   Chief Complaint  Patient presents with  . Follow-up    for allergic reaction  to a dog. dermatitis skin tear to left arm.    HPI:   Courtney Newton is a 80 y.o. female who presents for Follow up on rash.  Treated for facial rash complicated by impetigo with keflex by [facility] and referred to Derm for rapidly progressive rash that had a bullous appearance and was presume to be due to contact with a friend's dog, who had a similar reaction.  [facility] 20 mg daily prednisone dose with a taper of 8 days.  .  No biopsy done.  No follow up given.  Has since stopped the keflex bc it caused diarrhea   The swelling is resolving,  The itching has resolved.  The diarrhea has  Resolved.  She has returned the dog to his owner.    History reviewed. No pertinent past medical history.  Past Surgical History  Procedure Laterality Date  . Bilateral oophorectomy  1950  . Abdominal hysterectomy  1950    . pneumococcal 23 valent vaccine  0.5 mL Intramuscular Once     The following portions of the patient's history were reviewed and updated as appropriate: Allergies, current medications, and problem list.    Review of Systems:   Patient denies headache, fevers, malaise, unintentional weight loss, skin rash, eye pain, sinus congestion and sinus pain, sore throat, dysphagia,  hemoptysis , cough, dyspnea, wheezing, chest pain,  palpitations, orthopnea, edema, abdominal pain, nausea, melena, diarrhea, constipation, flank pain, dysuria, hematuria, urinary  Frequency, nocturia, numbness, tingling, seizures,  Focal weakness, Loss of consciousness,  Tremor, insomnia, depression, anxiety, and suicidal ideation.     History   Social History  . Marital Status: Widowed    Spouse Name: N/A    Number of Children: N/A  . Years of Education: N/A   Occupational History  . Not on file.   Social History Main Topics  . Smoking status: Never Smoker   . Smokeless tobacco: Not on file  . Alcohol Use: Yes  . Drug Use: No  . Sexual Activity: Not on file   Other Topics Concern  . Not on file   Social History Narrative  . No narrative on file    Objective:  Filed Vitals:   12/21/13 1130  BP: 140/74  Pulse: 71  Temp: 98.2 F (36.8 C)  Resp: 12     General appearance: alert, cooperative and appears stated age Ears: normal TM's and external ear canals both ears Lungs: clear to auscultation bilaterally Heart: regular rate and rhythm, S1, S2 normal, no murmur, click, rub or gallop Abdomen: soft, non-tender; bowel sounds normal; no masses,  no organomegaly Pulses: 2+ and symmetric Skin: Skin color, texture, turgor  Returning to normal with  very faint erythema or forehead,  Left upper extremity with large skin tear.  Granulating wound  bed without purulence or erythema.  Lymph nodes: Cervical, supraclavicular, and axillary nodes normal.  Assessment and Plan:  Impetigo couse of keflex was stopped after 4 days due to diarrhea,  But infection is resolved cllinically.   Dermatitis Contact, secondary to contact with dog.   Resolving with prolonged prednisone taper started by Gwen Pounds.   Skin tear of left upper arm without complication Wound was dressed and patient was advised to keep it covered with vaseline and nonstick telfa until resolved.     Updated Medication List Outpatient Encounter Prescriptions as of  12/21/2013  Medication Sig  . acetaminophen (TYLENOL) 500 MG tablet Take 500 mg by mouth every 6 (six) hours as needed.  . Calcium Carbonate (CALCIUM 600 PO) Take 1 tablet by mouth daily.  Marland Kitchen desonide (DESOWEN) 0.05 % cream Apply 1 application topically 2 (two) times daily.  . Multiple Vitamins-Minerals (MULTIVITAMIN PO) Take 1 tablet by mouth daily.  . predniSONE (DELTASONE) 20 MG tablet Take 20 mg by mouth daily with breakfast.  . cephALEXin (KEFLEX) 500 MG capsule Take 1 capsule (500 mg total) by mouth 2 (two) times daily.  . Menthol, Topical Analgesic, 2.5 % GEL Apply 2.5 % topically as needed.  . methocarbamol (ROBAXIN) 500 MG tablet Take 1 tablet (500 mg total) by mouth 3 (three) times daily as needed for muscle spasms.  . naproxen sodium (ANAPROX) 220 MG tablet Take 220 mg by mouth 2 (two) times daily as needed.  . predniSONE (DELTASONE) 10 MG tablet Take 60mg  by mouth on day 1, then taper by 10mg  daily until gone  . trolamine salicylate (ASPERCREME) 10 % cream Apply 1 application topically as needed for muscle pain.     Orders Placed This Encounter  Procedures  . CBC with Differential  . Comprehensive metabolic panel  . TSH  . Vit D  25 hydroxy (rtn osteoporosis monitoring)  . Lipid panel    No Follow-up on file.

## 2013-12-21 NOTE — Progress Notes (Signed)
Pre-visit discussion using our clinic review tool. No additional management support is needed unless otherwise documented below in the visit note.  

## 2013-12-21 NOTE — Patient Instructions (Signed)
You are recovering very well!  Finish Dr Londell MohKoalski's prednisone taper  Save the other prednisone taper for a future episode of rash  Keep your arm wound covered until the skin heals, so it doesn't get too dry and doesn't get infected  Return in two weeks for fasting labs and flu shot

## 2013-12-23 ENCOUNTER — Encounter: Payer: Self-pay | Admitting: Internal Medicine

## 2013-12-23 DIAGNOSIS — S41112A Laceration without foreign body of left upper arm, initial encounter: Secondary | ICD-10-CM | POA: Insufficient documentation

## 2013-12-23 NOTE — Assessment & Plan Note (Signed)
couse of keflex was stopped after 4 days due to diarrhea,  But infection is resolved cllinically.

## 2013-12-23 NOTE — Assessment & Plan Note (Signed)
Wound was dressed and patient was advised to keep it covered with vaseline and nonstick telfa until resolved.

## 2013-12-23 NOTE — Assessment & Plan Note (Signed)
Contact, secondary to contact with dog.   Resolving with prolonged prednisone taper started by G[facility]oundsKowalski.

## 2014-01-01 ENCOUNTER — Ambulatory Visit (INDEPENDENT_AMBULATORY_CARE_PROVIDER_SITE_OTHER): Payer: Commercial Managed Care - HMO | Admitting: *Deleted

## 2014-01-01 ENCOUNTER — Encounter: Payer: Self-pay | Admitting: Internal Medicine

## 2014-01-01 ENCOUNTER — Other Ambulatory Visit (INDEPENDENT_AMBULATORY_CARE_PROVIDER_SITE_OTHER): Payer: Commercial Managed Care - HMO

## 2014-01-01 DIAGNOSIS — E785 Hyperlipidemia, unspecified: Secondary | ICD-10-CM

## 2014-01-01 DIAGNOSIS — E559 Vitamin D deficiency, unspecified: Secondary | ICD-10-CM

## 2014-01-01 DIAGNOSIS — R5383 Other fatigue: Secondary | ICD-10-CM

## 2014-01-01 DIAGNOSIS — Z23 Encounter for immunization: Secondary | ICD-10-CM

## 2014-01-01 LAB — LIPID PANEL
CHOL/HDL RATIO: 4
Cholesterol: 277 mg/dL — ABNORMAL HIGH (ref 0–200)
HDL: 76.1 mg/dL (ref >39.00)
LDL CALC: 175 mg/dL — AB (ref 0–99)
NonHDL: 200.9
TRIGLYCERIDES: 129 mg/dL (ref 0.0–149.0)
VLDL: 25.8 mg/dL (ref 0.0–40.0)

## 2014-01-01 LAB — CBC WITH DIFFERENTIAL/PLATELET
Basophils Absolute: 0 10*3/uL (ref 0.0–0.1)
Basophils Relative: 0.3 % (ref 0.0–3.0)
EOS PCT: 2 % (ref 0.0–5.0)
Eosinophils Absolute: 0.2 10*3/uL (ref 0.0–0.7)
HCT: 42.2 % (ref 36.0–46.0)
Hemoglobin: 13.7 g/dL (ref 12.0–15.0)
Lymphocytes Relative: 38.7 % (ref 12.0–46.0)
Lymphs Abs: 4.1 10*3/uL — ABNORMAL HIGH (ref 0.7–4.0)
MCHC: 32.5 g/dL (ref 30.0–36.0)
MCV: 93.7 fl (ref 78.0–100.0)
MONO ABS: 0.9 10*3/uL (ref 0.1–1.0)
MONOS PCT: 8.7 % (ref 3.0–12.0)
NEUTROS PCT: 50.3 % (ref 43.0–77.0)
Neutro Abs: 5.4 10*3/uL (ref 1.4–7.7)
Platelets: 235 10*3/uL (ref 150.0–400.0)
RBC: 4.5 Mil/uL (ref 3.87–5.11)
RDW: 14.4 % (ref 11.5–15.5)
WBC: 10.7 10*3/uL — AB (ref 4.0–10.5)

## 2014-01-01 LAB — COMPREHENSIVE METABOLIC PANEL
ALBUMIN: 3.7 g/dL (ref 3.5–5.2)
ALK PHOS: 49 U/L (ref 39–117)
ALT: 16 U/L (ref 0–35)
AST: 29 U/L (ref 0–37)
BILIRUBIN TOTAL: 0.7 mg/dL (ref 0.2–1.2)
BUN: 20 mg/dL (ref 6–23)
CO2: 26 meq/L (ref 19–32)
Calcium: 9.3 mg/dL (ref 8.4–10.5)
Chloride: 105 mEq/L (ref 96–112)
Creatinine, Ser: 1.1 mg/dL (ref 0.4–1.2)
GFR: 52.29 mL/min — ABNORMAL LOW (ref >60.00)
GLUCOSE: 84 mg/dL (ref 70–99)
POTASSIUM: 3.6 meq/L (ref 3.5–5.1)
Sodium: 142 mEq/L (ref 135–145)
TOTAL PROTEIN: 7.2 g/dL (ref 6.0–8.3)

## 2014-01-01 LAB — TSH: TSH: 6.18 u[IU]/mL — AB (ref 0.35–4.50)

## 2014-01-01 LAB — VITAMIN D 25 HYDROXY (VIT D DEFICIENCY, FRACTURES): VITD: 73.56 ng/mL (ref 30.00–100.00)

## 2014-01-02 ENCOUNTER — Encounter: Payer: Self-pay | Admitting: *Deleted

## 2014-02-12 ENCOUNTER — Other Ambulatory Visit (INDEPENDENT_AMBULATORY_CARE_PROVIDER_SITE_OTHER): Payer: Commercial Managed Care - HMO

## 2014-02-12 ENCOUNTER — Telehealth: Payer: Self-pay | Admitting: *Deleted

## 2014-02-12 DIAGNOSIS — E034 Atrophy of thyroid (acquired): Secondary | ICD-10-CM

## 2014-02-12 DIAGNOSIS — E038 Other specified hypothyroidism: Secondary | ICD-10-CM

## 2014-02-12 NOTE — Telephone Encounter (Signed)
What labs and dx?  

## 2014-02-13 LAB — T4 AND TSH
T4, Total: 7.6 ug/dL (ref 4.5–12.0)
TSH: 4.1 u[IU]/mL (ref 0.450–4.500)

## 2014-02-15 ENCOUNTER — Encounter: Payer: Self-pay | Admitting: Internal Medicine

## 2014-05-07 ENCOUNTER — Ambulatory Visit (INDEPENDENT_AMBULATORY_CARE_PROVIDER_SITE_OTHER): Payer: Commercial Managed Care - HMO | Admitting: Nurse Practitioner

## 2014-05-07 ENCOUNTER — Encounter: Payer: Self-pay | Admitting: Nurse Practitioner

## 2014-05-07 VITALS — BP 140/72 | HR 91 | Temp 99.4°F | Resp 14 | Ht 63.5 in | Wt 103.0 lb

## 2014-05-07 DIAGNOSIS — R0600 Dyspnea, unspecified: Secondary | ICD-10-CM | POA: Insufficient documentation

## 2014-05-07 DIAGNOSIS — R05 Cough: Secondary | ICD-10-CM

## 2014-05-07 DIAGNOSIS — R0689 Other abnormalities of breathing: Secondary | ICD-10-CM

## 2014-05-07 DIAGNOSIS — R059 Cough, unspecified: Secondary | ICD-10-CM

## 2014-05-07 MED ORDER — LEVOFLOXACIN 750 MG PO TABS: 750.0000 mg | ORAL_TABLET | Freq: Every day | ORAL | Status: DC

## 2014-05-07 NOTE — Patient Instructions (Signed)
Delsym over the counter syrup is helpful for the cough  Levaquin is an antibiotic you will take 1 pill each day for 7 days.   If you feel worse or fail to improve in 3 days. Call us.

## 2014-05-07 NOTE — Progress Notes (Signed)
Pre visit review using our clinic review tool, if applicable. No additional management support is needed unless otherwise documented below in the visit note. 

## 2014-05-07 NOTE — Assessment & Plan Note (Signed)
Unsure if bronchitis or possible pneumonia. Pt would like to wait for x-ray of chest (gave her 3 days on abx, if not improving will obtain), Worsening symptoms over 2 weeks. Rx for levaquin 1 tablet x 7 days. Delsym OTC for cough. Asked pt to call if not improving or worsening.

## 2014-05-07 NOTE — Progress Notes (Signed)
   Subjective:    Patient ID: Courtney Newton, female    DOB: 03-Feb-1927, 81 y.o.   MRN: [id number]  HPI  Courtney Newton is a 81 yo female with a CC of cough with phlegm and congestion x 2 weeks.  1) Began with drainage and clearing throat, feels like it is more in the chest, cough is non-productive,  Tried OTC cold pills- not helpful   Review of Systems  Constitutional: Positive for fever. Negative for chills, diaphoresis and fatigue.  HENT: Positive for congestion and sinus pressure. Negative for ear discharge, ear pain, postnasal drip, rhinorrhea, sneezing and sore throat.        Maxillary sinus pressure  Eyes: Negative for visual disturbance.  Respiratory: Positive for cough, shortness of breath and wheezing. Negative for chest tightness.   Cardiovascular: Negative for chest pain, palpitations and leg swelling.  Gastrointestinal: Negative for nausea, vomiting and diarrhea.  Skin: Negative for rash.  Neurological: Negative for dizziness and headaches.       Objective:   Physical Exam  Constitutional: She is oriented to person, place, and time. She appears well-developed and well-nourished. No distress.  BP 140/72 mmHg  Pulse 91  Temp(Src) 99.4 F (37.4 C) (Oral)  Resp 14  Ht 5' 3.5" (1.613 m)  Wt 103 lb (46.72 kg)  BMI 17.96 kg/m2  SpO2 94% Historically has been variable between 92% and 98%   HENT:  Head: Normocephalic and atraumatic.  Right Ear: External ear normal.  Left Ear: External ear normal.  Mouth/Throat: No oropharyngeal exudate.  Eyes: Conjunctivae and EOM are normal. Pupils are equal, round, and reactive to light. Right eye exhibits no discharge. Left eye exhibits no discharge. No scleral icterus.  Neck: Normal range of motion. Neck supple. No thyromegaly present.  Cardiovascular: Normal rate, regular rhythm, normal heart sounds and intact distal pulses.  Exam reveals no gallop and no friction rub.   No murmur heard. Pulmonary/Chest: Effort normal. No  respiratory distress. She has wheezes. She has no rales. She exhibits no tenderness.  Wheezes in upper lobes, lower lobes clear  Lymphadenopathy:    She has no cervical adenopathy.  Neurological: She is alert and oriented to person, place, and time. No cranial nerve deficit. She exhibits normal muscle tone. Coordination normal.  Skin: Skin is warm and dry. No rash noted. She is not diaphoretic.  Psychiatric: She has a normal mood and affect. Her behavior is normal. Judgment and thought content normal.      Assessment & Plan:

## 2014-05-14 ENCOUNTER — Telehealth: Payer: Self-pay | Admitting: Internal Medicine

## 2014-05-14 NOTE — Telephone Encounter (Signed)
[facility] [place] [facility] - [place] TELEPHONE ADVICE RECORD [facility] Patient Name: Courtney Newton DOB: 1927-02-27 Initial Comment Caller states she has a chest cold and she has had it all week. Nurse Assessment Nurse: Chrys RacerKoenig, RN, Alexia FreestoneAnna Marie Date/Time Lamount Cohen(Eastern Time): 05/14/2014 4:03:10 PM Confirm and document reason for call. If symptomatic, describe symptoms. ---Caller states she has a chest cold and she has had it all week. Was seen last week, prescribed antibiotic and cough syrup. Afebrile. No N/V/D Has the patient traveled out of the country within the last 30 days? ---No Does the patient require triage? ---Yes Related visit to physician within the last 2 weeks? ---Yes Does the PT have any chronic conditions? (i.e. diabetes, asthma, etc.) ---No Guidelines Guideline Title Affirmed Question Affirmed Notes Cough - Acute Productive Cough present > 10 days Final Disposition User See PCP When Office is Open (within 3 days) Chrys RacerKoenig, RN, Alexia FreestoneAnna Marie Comments pt is insisting on an appt for her, as follow-up [facility] [place] [facility] Appt scheduled with NP Naomie Deanarrie Doss on Feb 24th at 1:30. TC back to pt and gave this information

## 2014-05-14 NOTE — Telephone Encounter (Signed)
FYI Appt scheduled w Tami Ribasoss

## 2014-05-15 ENCOUNTER — Encounter: Payer: Self-pay | Admitting: Nurse Practitioner

## 2014-05-15 ENCOUNTER — Ambulatory Visit (INDEPENDENT_AMBULATORY_CARE_PROVIDER_SITE_OTHER): Payer: Commercial Managed Care - HMO | Admitting: Nurse Practitioner

## 2014-05-15 VITALS — BP 140/74 | HR 79 | Temp 98.2°F | Resp 14 | Ht 63.5 in | Wt 103.4 lb

## 2014-05-15 DIAGNOSIS — R059 Cough, unspecified: Secondary | ICD-10-CM

## 2014-05-15 DIAGNOSIS — R05 Cough: Secondary | ICD-10-CM

## 2014-05-15 MED ORDER — PREDNISONE 10 MG PO TABS: ORAL_TABLET | ORAL | Status: DC

## 2014-05-15 NOTE — Patient Instructions (Addendum)
Saline nasal spray to clear out nose.   Prednisone with breakfast  6 tablets on day 1, 5 tablets on day 2, 4 tablets on day 3, 3 tablets on day 4, 2 tablets day 5, 1 tablet on day 6...done!   Delsym cough syrup as recommended.   Call us if not improving in 1 week.

## 2014-05-15 NOTE — Progress Notes (Signed)
Pre visit review using our clinic review tool, if applicable. No additional management support is needed unless otherwise documented below in the visit note. 

## 2014-05-15 NOTE — Progress Notes (Signed)
   Subjective:    Patient ID: Lucienne MinksMarilyn R Ha, female    DOB: 1927/01/03, 81 y.o.   MRN: [id number]  HPI  Ms. Arne ClevelandHealy is a 81 yo female with a CC of cough x 7 days.   1) Cough x 7 days with nasal and chest congestion, yellow phlegm. Levaquin finished Monday. Today she reports rhinorrhea- clear  Cough- improved   Review of Systems  Constitutional: Positive for fatigue. Negative for fever, chills and diaphoresis.  HENT: Positive for rhinorrhea. Negative for congestion, dental problem, ear discharge, ear pain, sinus pressure, sneezing and sore throat.   Eyes: Negative for visual disturbance.  Respiratory: Positive for cough and shortness of breath. Negative for chest tightness and wheezing.   Cardiovascular: Negative for chest pain, palpitations and leg swelling.  Gastrointestinal: Negative for vomiting.       Objective:   Physical Exam  Constitutional:  BP 140/74 mmHg  Pulse 79  Temp(Src) 98.2 F (36.8 C) (Oral)  Resp 14  Ht 5' 3.5" (1.613 m)  Wt 103 lb 6.4 oz (46.902 kg)  BMI 18.03 kg/m2  SpO2 95% Repeat 97%   Pulmonary/Chest: Effort normal. No respiratory distress. She has wheezes. She has no rales. She exhibits no tenderness.  Skin: Skin is warm and dry. No rash noted.  Psychiatric: She has a normal mood and affect. Her behavior is normal. Judgment and thought content normal.      Assessment & Plan:

## 2014-05-18 NOTE — Assessment & Plan Note (Signed)
Improved. Saline nasal spray, prednisone taper, and continue delsym. Call if not improving in 1 week.

## 2014-06-24 ENCOUNTER — Encounter: Payer: Self-pay | Admitting: Internal Medicine

## 2014-06-24 ENCOUNTER — Ambulatory Visit (INDEPENDENT_AMBULATORY_CARE_PROVIDER_SITE_OTHER): Payer: Commercial Managed Care - HMO | Admitting: Internal Medicine

## 2014-06-24 ENCOUNTER — Ambulatory Visit
Admit: 2014-06-24 | Disposition: A | Discharge status: Home / Self Care | Payer: Self-pay | Attending: Internal Medicine | Admitting: Internal Medicine

## 2014-06-24 VITALS — BP 138/68 | HR 79 | Temp 97.8°F | Resp 16 | Ht 64.0 in | Wt 103.0 lb

## 2014-06-24 DIAGNOSIS — Z1239 Encounter for other screening for malignant neoplasm of breast: Secondary | ICD-10-CM

## 2014-06-24 DIAGNOSIS — R0600 Dyspnea, unspecified: Secondary | ICD-10-CM

## 2014-06-24 DIAGNOSIS — R636 Underweight: Secondary | ICD-10-CM | POA: Diagnosis not present

## 2014-06-24 DIAGNOSIS — M810 Age-related osteoporosis without current pathological fracture: Secondary | ICD-10-CM

## 2014-06-24 DIAGNOSIS — Z0000 Encounter for general adult medical examination without abnormal findings: Secondary | ICD-10-CM | POA: Diagnosis not present

## 2014-06-24 DIAGNOSIS — R0689 Other abnormalities of breathing: Secondary | ICD-10-CM

## 2014-06-24 DIAGNOSIS — E559 Vitamin D deficiency, unspecified: Secondary | ICD-10-CM | POA: Diagnosis not present

## 2014-06-24 LAB — CBC WITH DIFFERENTIAL/PLATELET
Basophils Absolute: 0 10*3/uL (ref 0.0–0.1)
Basophils Relative: 0.5 % (ref 0.0–3.0)
Eosinophils Absolute: 0.1 10*3/uL (ref 0.0–0.7)
Eosinophils Relative: 1.1 % (ref 0.0–5.0)
HCT: 41.5 % (ref 36.0–46.0)
Hemoglobin: 14.1 g/dL (ref 12.0–15.0)
Lymphocytes Relative: 35.7 % (ref 12.0–46.0)
Lymphs Abs: 2.8 10*3/uL (ref 0.7–4.0)
MCHC: 33.9 g/dL (ref 30.0–36.0)
MCV: 91 fl (ref 78.0–100.0)
Monocytes Absolute: 1 10*3/uL (ref 0.1–1.0)
Monocytes Relative: 13.2 % — ABNORMAL HIGH (ref 3.0–12.0)
NEUTROS PCT: 49.5 % (ref 43.0–77.0)
Neutro Abs: 3.8 10*3/uL (ref 1.4–7.7)
Platelets: 248 10*3/uL (ref 150.0–400.0)
RBC: 4.55 Mil/uL (ref 3.87–5.11)
RDW: 14.1 % (ref 11.5–15.5)
WBC: 7.7 10*3/uL (ref 4.0–10.5)

## 2014-06-24 LAB — COMPREHENSIVE METABOLIC PANEL
ALBUMIN: 4.1 g/dL (ref 3.5–5.2)
ALK PHOS: 65 U/L (ref 39–117)
ALT: 13 U/L (ref 0–35)
AST: 25 U/L (ref 0–37)
BUN: 20 mg/dL (ref 6–23)
CO2: 28 mEq/L (ref 19–32)
CREATININE: 1.04 mg/dL (ref 0.40–1.20)
Calcium: 9.8 mg/dL (ref 8.4–10.5)
Chloride: 104 mEq/L (ref 96–112)
GFR: 53.39 mL/min — ABNORMAL LOW (ref >60.00)
Glucose, Bld: 79 mg/dL (ref 70–99)
Potassium: 4.5 mEq/L (ref 3.5–5.1)
Sodium: 140 mEq/L (ref 135–145)
Total Bilirubin: 0.6 mg/dL (ref 0.2–1.2)
Total Protein: 6.4 g/dL (ref 6.0–8.3)

## 2014-06-24 LAB — TSH: TSH: 6.86 u[IU]/mL — ABNORMAL HIGH (ref 0.35–4.50)

## 2014-06-24 LAB — VITAMIN D 25 HYDROXY (VIT D DEFICIENCY, FRACTURES): VITD: 43.12 ng/mL (ref 30.00–100.00)

## 2014-06-24 MED ORDER — ALBUTEROL SULFATE HFA 108 (90 BASE) MCG/ACT IN AERS: 2.0000 | INHALATION_SPRAY | Freq: Four times a day (QID) | RESPIRATORY_TRACT | Status: DC | PRN

## 2014-06-24 MED ORDER — ALBUTEROL SULFATE (2.5 MG/3ML) 0.083% IN NEBU
2.5000 mg | INHALATION_SOLUTION | Freq: Once | RESPIRATORY_TRACT | Status: AC
  Administered 2014-06-24: 2.5 mg via RESPIRATORY_TRACT

## 2014-06-24 NOTE — Progress Notes (Signed)
Patient ID: Courtney Newton, female   DOB: 08-Nov-1926, 81 y.o.   MRN: [id number]    The patient is here for annual Medicare wellness examination and management of other chronic and acute problems.   Cc  Still coughing from February viral infection,  Cough occurs sporadically but is nonproductive.  Not worse at night .  But her shortness of breath, which she describes as chronic and previously associated with walking Is Now worse and occurring with minimal exertion.   No orthopnea .  Could not sing with choir due to irritation and need to cough.    The risk factors are reflected in the social history.  The roster of all physicians providing medical care to patient - is listed in the Snapshot section of the chart.  Activities of daily living:  The patient is 100% independent in all ADLs: dressing, toileting, feeding as well as independent mobility  Home safety : The patient has smoke detectors in the home. They wear seatbelts.  There are no firearms at home. There is no violence in the home.   There is no risks for hepatitis, STDs or HIV. There is no   history of blood transfusion. They have no travel history to infectious disease endemic areas of the world.  The patient has seen their dentist in the last six month. They have not seen their eye doctor in the last year. She wears hearing aids and has regular follow up with audiology. .  They do not  have excessive sun exposure. Discussed the need for sun protection: hats, long sleeves and use of sunscreen if there is significant sun exposure.   Diet: the importance of a healthy diet is discussed. They do have a healthy diet.   The benefits of regular aerobic exercise were discussed. She has been too dysnpeic to walk daily for the last 2 months..   Depression screen: there are no signs or vegative symptoms of depression- irritability, change in appetite, anhedonia, sadness/tearfullness.  Cognitive assessment: the patient manages all their  financial and personal affairs and is actively engaged. They could relate day,date,year and events; recalled 2/3 objects at 3 minutes; performed clock-face test normally.  The following portions of the patient's history were reviewed and updated as appropriate: allergies, current medications, past family history, past medical history,  past surgical history, past social history  and problem list.  Visual acuity was not assessed per patient preference since she has regular follow up with her ophthalmologist. Hearing and body mass index were assessed and reviewed.   During the course of the visit the patient was educated and counseled about appropriate screening and preventive services including : fall prevention , diabetes screening, nutrition counseling, colorectal cancer screening, and recommended immunizations.    Review of Systems:  Patient denies headache, fevers, malaise, unintentional weight loss, skin rash, eye pain, sinus congestion and sinus pain, sore throat, dysphagia,  hemoptysis , cough, dyspnea, wheezing, chest pain, palpitations, orthopnea, edema, abdominal pain, nausea, melena, diarrhea, constipation, flank pain, dysuria, hematuria, urinary  Frequency, nocturia, numbness, tingling, seizures,  Focal weakness, Loss of consciousness,  Tremor, insomnia, depression, anxiety, and suicidal ideation.    Objective:  BP 138/68 mmHg  Pulse 79  Temp(Src) 97.8 F (36.6 C) (Oral)  Resp 16  Ht 5\' 4"  (1.626 m)  Wt 103 lb (46.72 kg)  BMI 17.67 kg/m2  SpO2 97%  General appearance: thin, alert, cooperative and appears stated age Head: Normocephalic, without obvious abnormality, atraumatic Eyes: conjunctivae/corneas clear. PERRL, EOM's intact.  Fundi benign. Ears: normal TM's and external ear canals both ears Nose: Nares normal. Septum midline. Mucosa normal. No drainage or sinus tenderness. Throat: lips, mucosa, and tongue normal; teeth and gums normal Neck: no adenopathy, no carotid bruit,  no JVD, supple, symmetrical, trachea midline and thyroid not enlarged, symmetric, no tenderness/mass/nodules Lungs: inspiratory squeaks on the right, clear to auscultation bilaterally Breasts: normal appearance, no masses or tenderness Heart: regular rate and rhythm, S1, S2 normal, no murmur, click, rub or gallop Abdomen: soft, non-tender; bowel sounds normal; no masses,  no organomegaly Extremities: extremities normal, atraumatic, no cyanosis or edema Pulses: 2+ and symmetric Skin: Skin color, texture, turgor normal. No rashes or lesions Neurologic: Alert and oriented X 3, normal strength and tone. Normal symmetric reflexes. Normal coordination and gait.   Assessment and Plan:  Problem List Items Addressed This Visit      Unprioritized   Underweight   Relevant Orders   TSH (Completed)   Comprehensive metabolic panel (Completed)   Osteoporosis    She remains uninterested in therapy and has no history of fractures.  Discussed the current controversies surrounding the risks and benefits of calcium supplementation.  Encouraged her to increase dietary calcium through natural foods including almond/coconut milk      Routine general medical examination at a health care facility    Annual Medicare wellness  exam was done as well as a comprehensive physical exam and management of acute and chronic conditions .  During the course of the visit the patient was educated and counseled about appropriate screening and preventive services including : fall prevention , diabetes screening, nutrition counseling, colorectal cancer screening, and recommended immunizations.  Printed recommendations for health maintenance screenings was given.       Dyspnea and respiratory abnormality - Primary    Progressive,  Following a respiratory illness in febrary,  X Ray shows ATX vs scarring in the left middle lobe,  And hyperinflation suggestive of COPd.  Albuterol MDI given during visit.       Relevant Medications    albuterol (PROVENTIL HFA;VENTOLIN HFA) inhaler   albuterol (PROVENTIL) (2.5 MG/3ML) 0.083% nebulizer solution 2.5 mg (Completed)   Other Relevant Orders   DG Chest 2 View   CBC with Differential/Platelet (Completed)    Other Visit Diagnoses    Breast cancer screening        Relevant Orders    MM DIGITAL SCREENING BILATERAL    Vitamin D deficiency        Relevant Orders    Vit D  25 hydroxy (rtn osteoporosis monitoring) (Completed)

## 2014-06-24 NOTE — Progress Notes (Signed)
Pre-visit discussion using our clinic review tool. No additional management support is needed unless otherwise documented below in the visit note.  

## 2014-06-24 NOTE — Patient Instructions (Signed)
please get your chest x ray done today  Please schedule a separate visit to discuss goals of care at this point in your life Health Maintenance Adopting a healthy lifestyle and getting preventive care can go a long way to promote health and wellness. Talk with your health care provider about what schedule of regular examinations is right for you. This is a good chance for you to check in with your provider about disease prevention and staying healthy. In between checkups, there are plenty of things you can do on your own. Experts have done a lot of research about which lifestyle changes and preventive measures are most likely to keep you healthy. Ask your health care provider for more information. WEIGHT AND DIET  Eat a healthy diet  Be sure to include plenty of vegetables, fruits, low-fat dairy products, and lean protein.  Do not eat a lot of foods high in solid fats, added sugars, or salt.  Get regular exercise. This is one of the most important things you can do for your health.  Most adults should exercise for at least 150 minutes each week. The exercise should increase your heart rate and make you sweat (moderate-intensity exercise).  Most adults should also do strengthening exercises at least twice a week. This is in addition to the moderate-intensity exercise.  Maintain a healthy weight  Body mass index (BMI) is a measurement that can be used to identify possible weight problems. It estimates body fat based on height and weight. Your health care provider can help determine your BMI and help you achieve or maintain a healthy weight.  For females 1 years of age and older:   A BMI below 18.5 is considered underweight.  A BMI of 18.5 to 24.9 is normal.  A BMI of 25 to 29.9 is considered overweight.  A BMI of 30 and above is considered obese.  Watch levels of cholesterol and blood lipids  You should start having your blood tested for lipids and cholesterol at 79 years of age,  then have this test every 5 years.  You may need to have your cholesterol levels checked more often if:  Your lipid or cholesterol levels are high.  You are older than 79 years of age.  You are at high risk for heart disease.  CANCER SCREENING   Lung Cancer  Lung cancer screening is recommended for adults 34-45 years old who are at high risk for lung cancer because of a history of smoking.  A yearly low-dose CT scan of the lungs is recommended for people who:  Currently smoke.  Have quit within the past 15 years.  Have at least a 30-pack-year history of smoking. A pack year is smoking an average of one pack of cigarettes a day for 1 year.  Yearly screening should continue until it has been 15 years since you quit.  Yearly screening should stop if you develop a health problem that would prevent you from having lung cancer treatment.  Breast Cancer  Practice breast self-awareness. This means understanding how your breasts normally appear and feel.  It also means doing regular breast self-exams. Let your health care provider know about any changes, no matter how small.  If you are in your 20s or 30s, you should have a clinical breast exam (CBE) by a health care provider every 1-3 years as part of a regular health exam.  If you are 53 or older, have a CBE every year. Also consider having a breast X-ray (mammogram)  every year.  If you have a family history of breast cancer, talk to your health care provider about genetic screening.  If you are at high risk for breast cancer, talk to your health care provider about having an MRI and a mammogram every year.  Breast cancer gene (BRCA) assessment is recommended for women who have family members with BRCA-related cancers. BRCA-related cancers include:  Breast.  Ovarian.  Tubal.  Peritoneal cancers.  Results of the assessment will determine the need for genetic counseling and BRCA1 and BRCA2 testing. Cervical  Cancer Routine pelvic examinations to screen for cervical cancer are no longer recommended for nonpregnant women who are considered low risk for cancer of the pelvic organs (ovaries, uterus, and vagina) and who do not have symptoms. A pelvic examination may be necessary if you have symptoms including those associated with pelvic infections. Ask your health care provider if a screening pelvic exam is right for you.   The Pap test is the screening test for cervical cancer for women who are considered at risk.  If you had a hysterectomy for a problem that was not cancer or a condition that could lead to cancer, then you no longer need Pap tests.  If you are older than 65 years, and you have had normal Pap tests for the past 10 years, you no longer need to have Pap tests.  If you have had past treatment for cervical cancer or a condition that could lead to cancer, you need Pap tests and screening for cancer for at least 20 years after your treatment.  If you no longer get a Pap test, assess your risk factors if they change (such as having a new sexual partner). This can affect whether you should start being screened again.  Some women have medical problems that increase their chance of getting cervical cancer. If this is the case for you, your health care provider may recommend more frequent screening and Pap tests.  The human papillomavirus (HPV) test is another test that may be used for cervical cancer screening. The HPV test looks for the virus that can cause cell changes in the cervix. The cells collected during the Pap test can be tested for HPV.  The HPV test can be used to screen women 50 years of age and older. Getting tested for HPV can extend the interval between normal Pap tests from three to five years.  An HPV test also should be used to screen women of any age who have unclear Pap test results.  After 79 years of age, women should have HPV testing as often as Pap tests.  Colorectal  Cancer  This type of cancer can be detected and often prevented.  Routine colorectal cancer screening usually begins at 79 years of age and continues through 79 years of age.  Your health care provider may recommend screening at an earlier age if you have risk factors for colon cancer.  Your health care provider may also recommend using home test kits to check for hidden blood in the stool.  A small camera at the end of a tube can be used to examine your colon directly (sigmoidoscopy or colonoscopy). This is done to check for the earliest forms of colorectal cancer.  Routine screening usually begins at age 20.  Direct examination of the colon should be repeated every 5-10 years through 79 years of age. However, you may need to be screened more often if early forms of precancerous polyps or small growths are  found. Skin Cancer  Check your skin from head to toe regularly.  Tell your health care provider about any new moles or changes in moles, especially if there is a change in a mole's shape or color.  Also tell your health care provider if you have a mole that is larger than the size of a pencil eraser.  Always use sunscreen. Apply sunscreen liberally and repeatedly throughout the day.  Protect yourself by wearing long sleeves, pants, a wide-brimmed hat, and sunglasses whenever you are outside. HEART DISEASE, DIABETES, AND HIGH BLOOD PRESSURE   Have your blood pressure checked at least every 1-2 years. High blood pressure causes heart disease and increases the risk of stroke.  If you are between 97 years and 2 years old, ask your health care provider if you should take aspirin to prevent strokes.  Have regular diabetes screenings. This involves taking a blood sample to check your fasting blood sugar level.  If you are at a normal weight and have a low risk for diabetes, have this test once every three years after 79 years of age.  If you are overweight and have a high risk for  diabetes, consider being tested at a younger age or more often. PREVENTING INFECTION  Hepatitis B  If you have a higher risk for hepatitis B, you should be screened for this virus. You are considered at high risk for hepatitis B if:  You were born in a country where hepatitis B is common. Ask your health care provider which countries are considered high risk.  Your parents were born in a high-risk country, and you have not been immunized against hepatitis B (hepatitis B vaccine).  You have HIV or AIDS.  You use needles to inject street drugs.  You live with someone who has hepatitis B.  You have had sex with someone who has hepatitis B.  You get hemodialysis treatment.  You take certain medicines for conditions, including cancer, organ transplantation, and autoimmune conditions. Hepatitis C  Blood testing is recommended for:  Everyone born from [birth year] through 1963.  Anyone with known risk factors for hepatitis C. Sexually transmitted infections (STIs)  You should be screened for sexually transmitted infections (STIs) including gonorrhea and chlamydia if:  You are sexually active and are younger than 79 years of age.  You are older than 79 years of age and your health care provider tells you that you are at risk for this type of infection.  Your sexual activity has changed since you were last screened and you are at an increased risk for chlamydia or gonorrhea. Ask your health care provider if you are at risk.  If you do not have HIV, but are at risk, it may be recommended that you take a prescription medicine daily to prevent HIV infection. This is called pre-exposure prophylaxis (PrEP). You are considered at risk if:  You are sexually active and do not regularly use condoms or know the HIV status of your partner(s).  You take drugs by injection.  You are sexually active with a partner who has HIV. Talk with your health care provider about whether you are at high risk of  being infected with HIV. If you choose to begin PrEP, you should first be tested for HIV. You should then be tested every 3 months for as long as you are taking PrEP.  PREGNANCY   If you are premenopausal and you may become pregnant, ask your health care provider about preconception counseling.  If  you may become pregnant, take 400 to 800 micrograms (mcg) of folic acid every day.  If you want to prevent pregnancy, talk to your health care provider about birth control (contraception). OSTEOPOROSIS AND MENOPAUSE   Osteoporosis is a disease in which the bones lose minerals and strength with aging. This can result in serious bone fractures. Your risk for osteoporosis can be identified using a bone density scan.  If you are 100 years of age or older, or if you are at risk for osteoporosis and fractures, ask your health care provider if you should be screened.  Ask your health care provider whether you should take a calcium or vitamin D supplement to lower your risk for osteoporosis.  Menopause may have certain physical symptoms and risks.  Hormone replacement therapy may reduce some of these symptoms and risks. Talk to your health care provider about whether hormone replacement therapy is right for you.  HOME CARE INSTRUCTIONS   Schedule regular health, dental, and eye exams.  Stay current with your immunizations.   Do not use any tobacco products including cigarettes, chewing tobacco, or electronic cigarettes.  If you are pregnant, do not drink alcohol.  If you are breastfeeding, limit how much and how often you drink alcohol.  Limit alcohol intake to no more than 1 drink per day for nonpregnant women. One drink equals 12 ounces of beer, 5 ounces of wine, or 1 ounces of hard liquor.  Do not use street drugs.  Do not share needles.  Ask your health care provider for help if you need support or information about quitting drugs.  Tell your health care provider if you often feel  depressed.  Tell your health care provider if you have ever been abused or do not feel safe at home. Document Released: 09/21/2010 Document Revised: 07/23/2013 Document Reviewed: 02/07/2013 [name] Patient Information 2015 [place], [place]. This information is not intended to replace advice given to you by your health care provider. Make sure you discuss any questions you have with your health care provider.

## 2014-06-25 ENCOUNTER — Telehealth: Payer: Self-pay | Admitting: Internal Medicine

## 2014-06-25 ENCOUNTER — Encounter: Payer: Self-pay | Admitting: Internal Medicine

## 2014-06-25 NOTE — Assessment & Plan Note (Signed)
Progressive,  Following a respiratory illness in febrary,  X Ray shows ATX vs scarring in the left middle lobe,  And hyperinflation suggestive of COPd.  Albuterol MDI given during visit.

## 2014-06-25 NOTE — Assessment & Plan Note (Signed)

## 2014-06-25 NOTE — Assessment & Plan Note (Addendum)
She remains uninterested in therapy and has no history of fractures.  Discussed the current controversies surrounding the risks and benefits of calcium supplementation.  Encouraged her to increase dietary calcium through natural foods including almond/coconut milk

## 2014-06-25 NOTE — Telephone Encounter (Signed)
Your chest x ray  Showed hyperinflated lungs,  Which happens with asthma and emphysema, but there were no signs of infection your chest x ray or on your labs.   You do not need antibiotics , but the albuterol inhaler I prescribed yesterday should help your chest tightness.    Your thyroid function is a little bit  underactive. I would prefer to repeat the test in a few months since  You have no history of underactive thyroid .  The symptoms of low thyroid include :  Fatigue,. Wt gain,  constipaitn ,  Hair loss,  Dry skin. Please let me know if you are having any of these symptoms.

## 2014-07-04 ENCOUNTER — Ambulatory Visit (INDEPENDENT_AMBULATORY_CARE_PROVIDER_SITE_OTHER): Payer: Commercial Managed Care - HMO | Admitting: *Deleted

## 2014-07-04 DIAGNOSIS — Z23 Encounter for immunization: Secondary | ICD-10-CM | POA: Diagnosis not present

## 2014-07-16 ENCOUNTER — Encounter: Payer: Self-pay | Admitting: Internal Medicine

## 2015-07-06 ENCOUNTER — Emergency Department: Payer: PPO

## 2015-07-06 ENCOUNTER — Observation Stay
Admission: EM | Admit: 2015-07-06 | Discharge: 2015-07-08 | Disposition: A | Discharge status: Home / Self Care | Payer: PPO | Attending: Internal Medicine | Admitting: Internal Medicine

## 2015-07-06 ENCOUNTER — Encounter: Payer: Self-pay | Admitting: Emergency Medicine

## 2015-07-06 DIAGNOSIS — R0602 Shortness of breath: Secondary | ICD-10-CM | POA: Diagnosis not present

## 2015-07-06 DIAGNOSIS — R7989 Other specified abnormal findings of blood chemistry: Secondary | ICD-10-CM

## 2015-07-06 DIAGNOSIS — R778 Other specified abnormalities of plasma proteins: Secondary | ICD-10-CM

## 2015-07-06 DIAGNOSIS — Z9889 Other specified postprocedural states: Secondary | ICD-10-CM | POA: Insufficient documentation

## 2015-07-06 DIAGNOSIS — I700 Atherosclerosis of aorta: Secondary | ICD-10-CM | POA: Diagnosis not present

## 2015-07-06 DIAGNOSIS — R42 Dizziness and giddiness: Secondary | ICD-10-CM | POA: Insufficient documentation

## 2015-07-06 DIAGNOSIS — Z8249 Family history of ischemic heart disease and other diseases of the circulatory system: Secondary | ICD-10-CM | POA: Insufficient documentation

## 2015-07-06 DIAGNOSIS — M810 Age-related osteoporosis without current pathological fracture: Secondary | ICD-10-CM | POA: Insufficient documentation

## 2015-07-06 DIAGNOSIS — R002 Palpitations: Secondary | ICD-10-CM | POA: Diagnosis not present

## 2015-07-06 DIAGNOSIS — R748 Abnormal levels of other serum enzymes: Secondary | ICD-10-CM | POA: Diagnosis not present

## 2015-07-06 DIAGNOSIS — R531 Weakness: Secondary | ICD-10-CM | POA: Insufficient documentation

## 2015-07-06 DIAGNOSIS — E559 Vitamin D deficiency, unspecified: Secondary | ICD-10-CM | POA: Insufficient documentation

## 2015-07-06 DIAGNOSIS — M8580 Other specified disorders of bone density and structure, unspecified site: Secondary | ICD-10-CM | POA: Insufficient documentation

## 2015-07-06 DIAGNOSIS — J189 Pneumonia, unspecified organism: Secondary | ICD-10-CM | POA: Diagnosis not present

## 2015-07-06 DIAGNOSIS — Z9049 Acquired absence of other specified parts of digestive tract: Secondary | ICD-10-CM | POA: Diagnosis not present

## 2015-07-06 DIAGNOSIS — R0609 Other forms of dyspnea: Secondary | ICD-10-CM

## 2015-07-06 DIAGNOSIS — R069 Unspecified abnormalities of breathing: Secondary | ICD-10-CM | POA: Diagnosis not present

## 2015-07-06 HISTORY — DX: Other specified postprocedural states: Z98.890

## 2015-07-06 HISTORY — DX: Age-related osteoporosis without current pathological fracture: M81.0

## 2015-07-06 HISTORY — DX: Vitamin D deficiency, unspecified: E55.9

## 2015-07-06 LAB — CBC
HCT: 37.1 % (ref 35.0–47.0)
Hemoglobin: 12.4 g/dL (ref 12.0–16.0)
MCH: 31.1 pg (ref 26.0–34.0)
MCHC: 33.5 g/dL (ref 32.0–36.0)
MCV: 92.9 fL (ref 80.0–100.0)
PLATELETS: 159 10*3/uL (ref 150–440)
RBC: 4 MIL/uL (ref 3.80–5.20)
RDW: 13.2 % (ref 11.5–14.5)
WBC: 14.4 10*3/uL — AB (ref 3.6–11.0)

## 2015-07-06 LAB — COMPREHENSIVE METABOLIC PANEL
ALT: 28 U/L (ref 14–54)
AST: 45 U/L — AB (ref 15–41)
Albumin: 3.6 g/dL (ref 3.5–5.0)
Alkaline Phosphatase: 73 U/L (ref 38–126)
Anion gap: 8 (ref 5–15)
BUN: 39 mg/dL — AB (ref 6–20)
CHLORIDE: 104 mmol/L (ref 101–111)
CO2: 26 mmol/L (ref 22–32)
Calcium: 8.7 mg/dL — ABNORMAL LOW (ref 8.9–10.3)
Creatinine, Ser: 1.08 mg/dL — ABNORMAL HIGH (ref 0.44–1.00)
GFR, EST AFRICAN AMERICAN: 52 mL/min — AB (ref >60)
GFR, EST NON AFRICAN AMERICAN: 45 mL/min — AB (ref >60)
Glucose, Bld: 118 mg/dL — ABNORMAL HIGH (ref 65–99)
POTASSIUM: 3.5 mmol/L (ref 3.5–5.1)
Sodium: 138 mmol/L (ref 135–145)
Total Bilirubin: 1 mg/dL (ref 0.3–1.2)
Total Protein: 6.7 g/dL (ref 6.5–8.1)

## 2015-07-06 LAB — TROPONIN I: TROPONIN I: 0.2 ng/mL — AB (ref <0.031)

## 2015-07-06 MED ORDER — PANTOPRAZOLE SODIUM 40 MG PO TBEC
40.0000 mg | DELAYED_RELEASE_TABLET | Freq: Every day | ORAL | Status: DC
Start: 2015-07-06 — End: 2015-07-08
  Administered 2015-07-07 – 2015-07-08 (×2): 40 mg via ORAL
  Filled 2015-07-06 (×2): qty 1

## 2015-07-06 MED ORDER — DEXTROSE 5 % IV SOLN
500.0000 mg | INTRAVENOUS | Status: DC
  Administered 2015-07-07 – 2015-07-08 (×2): 500 mg via INTRAVENOUS
  Filled 2015-07-06 (×2): qty 500

## 2015-07-06 MED ORDER — CALCIUM CARBONATE-VITAMIN D 500-200 MG-UNIT PO TABS
1.0000 | ORAL_TABLET | Freq: Every day | ORAL | Status: DC
  Administered 2015-07-07 – 2015-07-08 (×2): 1 via ORAL
  Filled 2015-07-06 (×2): qty 1

## 2015-07-06 MED ORDER — ONDANSETRON HCL 4 MG PO TABS: 4.0000 mg | ORAL_TABLET | Freq: Four times a day (QID) | ORAL | Status: DC | PRN

## 2015-07-06 MED ORDER — DOCUSATE SODIUM 100 MG PO CAPS
100.0000 mg | ORAL_CAPSULE | Freq: Two times a day (BID) | ORAL | Status: DC
  Administered 2015-07-07 (×2): 100 mg via ORAL
  Filled 2015-07-06 (×5): qty 1

## 2015-07-06 MED ORDER — DEXTROSE 5 % IV SOLN
500.0000 mg | Freq: Once | INTRAVENOUS | Status: AC
  Administered 2015-07-06: 500 mg via INTRAVENOUS
  Filled 2015-07-06: qty 500

## 2015-07-06 MED ORDER — HEPARIN SODIUM (PORCINE) 5000 UNIT/ML IJ SOLN
5000.0000 [IU] | Freq: Three times a day (TID) | INTRAMUSCULAR | Status: DC
  Administered 2015-07-06 – 2015-07-07 (×3): 5000 [IU] via SUBCUTANEOUS
  Filled 2015-07-06 (×3): qty 1

## 2015-07-06 MED ORDER — GUAIFENESIN ER 600 MG PO TB12
600.0000 mg | ORAL_TABLET | Freq: Two times a day (BID) | ORAL | Status: DC
  Administered 2015-07-06 – 2015-07-08 (×4): 600 mg via ORAL
  Filled 2015-07-06 (×4): qty 1

## 2015-07-06 MED ORDER — ACETAMINOPHEN 325 MG PO TABS: 650.0000 mg | ORAL_TABLET | Freq: Four times a day (QID) | ORAL | Status: DC | PRN

## 2015-07-06 MED ORDER — ACETAMINOPHEN 650 MG RE SUPP: 650.0000 mg | Freq: Four times a day (QID) | RECTAL | Status: DC | PRN

## 2015-07-06 MED ORDER — ONDANSETRON HCL 4 MG/2ML IJ SOLN: 4.0000 mg | Freq: Four times a day (QID) | INTRAMUSCULAR | Status: DC | PRN

## 2015-07-06 MED ORDER — DEXTROSE 5 % IV SOLN
1.0000 g | INTRAVENOUS | Status: DC
  Administered 2015-07-07 – 2015-07-08 (×2): 1 g via INTRAVENOUS
  Filled 2015-07-06 (×2): qty 10

## 2015-07-06 MED ORDER — BISACODYL 10 MG RE SUPP: 10.0000 mg | Freq: Every day | RECTAL | Status: DC | PRN

## 2015-07-06 MED ORDER — DEXTROSE 5 % IV SOLN
1.0000 g | Freq: Once | INTRAVENOUS | Status: AC
  Administered 2015-07-06: 1 g via INTRAVENOUS
  Filled 2015-07-06: qty 10

## 2015-07-06 MED ORDER — ALBUTEROL SULFATE (2.5 MG/3ML) 0.083% IN NEBU: 2.5000 mg | INHALATION_SOLUTION | Freq: Four times a day (QID) | RESPIRATORY_TRACT | Status: DC | PRN

## 2015-07-06 MED ORDER — POTASSIUM CHLORIDE IN NACL 20-0.9 MEQ/L-% IV SOLN
INTRAVENOUS | Status: DC
  Administered 2015-07-06 – 2015-07-07 (×2): via INTRAVENOUS
  Filled 2015-07-06 (×5): qty 1000

## 2015-07-06 MED ORDER — ASPIRIN EC 81 MG PO TBEC
81.0000 mg | DELAYED_RELEASE_TABLET | Freq: Every day | ORAL | Status: DC
  Administered 2015-07-07 – 2015-07-08 (×2): 81 mg via ORAL
  Filled 2015-07-06 (×2): qty 1

## 2015-07-06 MED ORDER — IPRATROPIUM-ALBUTEROL 0.5-2.5 (3) MG/3ML IN SOLN
3.0000 mL | Freq: Four times a day (QID) | RESPIRATORY_TRACT | Status: DC
  Administered 2015-07-06 – 2015-07-07 (×4): 3 mL via RESPIRATORY_TRACT
  Filled 2015-07-06 (×4): qty 3

## 2015-07-06 MED ORDER — SODIUM CHLORIDE 0.9% FLUSH
3.0000 mL | Freq: Two times a day (BID) | INTRAVENOUS | Status: DC
  Administered 2015-07-06 – 2015-07-08 (×3): 3 mL via INTRAVENOUS

## 2015-07-06 NOTE — Progress Notes (Signed)
Tele box [id number] verified/confirmed.

## 2015-07-06 NOTE — ED Provider Notes (Signed)
[facility] Emergency Department Provider Note  ____________________________________________    I have reviewed the triage vital signs and the nursing notes.   HISTORY  Chief Complaint Weakness    HPI Courtney Newton is a 82 y.o. female who complains of shortness of breath with exertion over the last 3 days. She is never had this before. She denies cough. She denies fever. She denies chest pain. No pleurisy. No calf pain or swelling. No diaphoresis. She does not smoke.     Past Medical History  Diagnosis Date  . History of lumpectomy of both breasts     Patient Active Problem List   Diagnosis Date Noted  . Dyspnea and respiratory abnormality 05/07/2014  . Neck pain on right side 08/09/2013  . Shingles outbreak 01/03/2013  . Routine general medical examination at a health care facility 07/17/2012  . Underweight 04/01/2012  . Osteoporosis 04/01/2012  . Memory loss, short term 04/01/2012    Past Surgical History  Procedure Laterality Date  . Bilateral oophorectomy  1950  . Abdominal hysterectomy  1950  . Appendectomy      Current Outpatient Rx  Name  Route  Sig  Dispense  Refill  . acetaminophen (TYLENOL) 500 MG tablet   Oral   Take 500 mg by mouth every 6 (six) hours as needed.         Marland Kitchen albuterol (PROVENTIL HFA;VENTOLIN HFA) 108 (90 BASE) MCG/ACT inhaler   Inhalation   Inhale 2 puffs into the lungs every 6 (six) hours as needed for wheezing or shortness of breath.   1 Inhaler   2   . Calcium Carbonate (CALCIUM 600 PO)   Oral   Take 1 tablet by mouth daily.         Marland Kitchen desonide (DESOWEN) 0.05 % cream   Topical   Apply 1 application topically 2 (two) times daily.         . Menthol, Topical Analgesic, 2.5 % GEL   Apply externally   Apply 2.5 % topically as needed.         . methocarbamol (ROBAXIN) 500 MG tablet   Oral   Take 1 tablet (500 mg total) by mouth 3 (three) times daily as needed for muscle spasms. Patient not  taking: Reported on 06/24/2014   30 tablet   0   . Multiple Vitamins-Minerals (MULTIVITAMIN PO)   Oral   Take 1 tablet by mouth daily.         . naproxen sodium (ANAPROX) 220 MG tablet   Oral   Take 220 mg by mouth 2 (two) times daily as needed.         . trolamine salicylate (ASPERCREME) 10 % cream   Topical   Apply 1 application topically as needed for muscle pain.           Allergies Lovastatin  Family History  Problem Relation Age of Onset  . Heart disease Mother   . Heart disease Father     Social History Social History  Substance Use Topics  . Smoking status: Never Smoker   . Smokeless tobacco: None  . Alcohol Use: Yes    Review of Systems  Constitutional: Negative for fever. Eyes: Negative for redness ENT: Negative for sore throat Cardiovascular: Negative for chest pain Respiratory: As above Gastrointestinal: Negative for abdominal pain Genitourinary: Negative for dysuria. Musculoskeletal: Negative for back pain. Skin: Negative for rash. Neurological: Negative for focal weakness Psychiatric: no anxiety    ____________________________________________   PHYSICAL  EXAM:  VITAL SIGNS: ED Triage Vitals  Enc Vitals Group     BP 07/06/15 1404 124/64 mmHg     Pulse Rate 07/06/15 1404 64     Resp 07/06/15 1404 18     Temp 07/06/15 1404 97.8 F (36.6 C)     Temp Source 07/06/15 1404 Oral     SpO2 07/06/15 1404 99 %     Weight 07/06/15 1404 99 lb 1.6 oz (44.951 kg)     Height 07/06/15 1404 5\' 5"  (1.651 m)     Head Cir --      Peak Flow --      Pain Score --      Pain Loc --      Pain Edu? --      Excl. in GC? --      Constitutional: Alert and oriented. Well appearing and in no distress. Pleasant and interactive Eyes: Conjunctivae are normal. No erythema or injection ENT   Head: Normocephalic and atraumatic.   Mouth/Throat: Mucous membranes are moist. Cardiovascular: Normal rate, regular rhythm. Normal and symmetric distal pulses  are present in the upper extremities. No murmurs or rubs  Respiratory: Normal respiratory effort without tachypnea nor retractions. Right lobe rales Gastrointestinal: Soft and non-tender in all quadrants. No distention. There is no CVA tenderness. Genitourinary: deferred Musculoskeletal: Nontender with normal range of motion in all extremities.  Neurologic:  Normal speech and language. No gross focal neurologic deficits are appreciated. Skin:  Skin is warm, dry and intact. No rash noted. Psychiatric: Mood and affect are normal. Patient exhibits appropriate insight and judgment.  ____________________________________________    LABS (pertinent positives/negatives)  Labs Reviewed  CBC - Abnormal; Notable for the following:    WBC 14.4 (*)    All other components within normal limits  COMPREHENSIVE METABOLIC PANEL - Abnormal; Notable for the following:    Glucose, Bld 118 (*)    BUN 39 (*)    Creatinine, Ser 1.08 (*)    Calcium 8.7 (*)    AST 45 (*)    GFR calc non Af Amer 45 (*)    GFR calc Af Amer 52 (*)    All other components within normal limits  TROPONIN I - Abnormal; Notable for the following:    Troponin I 0.20 (*)    All other components within normal limits    ____________________________________________   EKG ED ECG REPORT I, Jene EveryKINNER, Khalea Ventura, the attending physician, personally viewed and interpreted this ECG.   Date: 07/06/2015  EKG Time: 2:20 PM  Rate: 74  Rhythm: normal sinus rhythm  Axis: Normal  Intervals:none  ST&T Change: Nonspecific   ____________________________________________    RADIOLOGY  Chest x-ray shows right upper lobe pneumonia  ____________________________________________   PROCEDURES  Procedure(s) performed: none  Critical Care performed: yes  CRITICAL CARE Performed by: Jene EveryKINNER, Jermeka Schlotterbeck   Total critical care time: 30minutes  Critical care time was exclusive of separately billable procedures and treating other  patients.  Critical care was necessary to treat or prevent imminent or life-threatening deterioration.  Critical care was time spent personally by me on the following activities: development of treatment plan with patient and/or surrogate as well as nursing, discussions with consultants, evaluation of patient's response to treatment, examination of patient, obtaining history from patient or surrogate, ordering and performing treatments and interventions, ordering and review of laboratory studies, ordering and review of radiographic studies, pulse oximetry and re-evaluation of patient's condition.   ____________________________________________   INITIAL IMPRESSION / ASSESSMENT AND  PLAN / ED COURSE  Pertinent labs & imaging results that were available during my care of the patient were reviewed by me and considered in my medical decision making (see chart for details).  Patient presents with shortness of breath especially with exertion. Overall she is well-appearing. Chest x-ray shows likely pneumonia with elevated white blood cell count. She does have an elevated troponin. EKG is unremarkable. She denies chest pain. I suspect heart strain related to dyspnea from the pneumonia but certainly this will need to be closely observed. Rocephin and azithromycin blood cultures ordered.  ____________________________________________   FINAL CLINICAL IMPRESSION(S) / ED DIAGNOSES  Final diagnoses:  Community acquired pneumonia  Elevated troponin          Jene Every, MD 07/06/15 1542

## 2015-07-06 NOTE — ED Notes (Signed)
Patient transported to X-ray 

## 2015-07-06 NOTE — Care Management Obs Status (Signed)
MEDICARE OBSERVATION STATUS NOTIFICATION   Patient Details  Name: Courtney Newton MRN: [id number] Date of Birth: 1926/06/26   Medicare Observation Status Notification Given:  Yes (dyspnea)    Caren MacadamMichelle Talor [place]rosiers, RN 07/06/2015, 4:40 PM

## 2015-07-06 NOTE — ED Notes (Signed)
Dr. Cyril LoosenKinner informed of critical troponin of 0.20

## 2015-07-06 NOTE — ED Notes (Signed)
Pt presents to ED c/o generalized weakness and exertional dyspnea since 07/04/15. No dyspnea noted on arrival, no c/o pain. Pt states she was waiting to see if symptoms would just go away but a friend told her to see a doctor today.

## 2015-07-06 NOTE — ED Notes (Signed)
Pt's friend Alger MemosLinda Brown: # [phone]

## 2015-07-06 NOTE — H&P (Signed)
History and Physical    Courtney Newton WJX:[id number] DOB: [birth year] DOA: 07/06/2015  Referring physician: Dr. Cyril LoosenKinner PCP: Sherlene ShamsULLO, TERESA L, MD  Specialists: none  Chief Complaint: SOB  HPI: Courtney Newton is a 82 y.o. female has a past medical history significant for osteoporosis and vitamin D deficiency now with 1 week hx of worsening SOB and DOE associated with palpitations and dizziness. In ER, CXR reveals pneumonia and troponin mildly elevated. She denies fever or cough. No CP or cardiac hx. She is now admitted.  Review of Systems: The patient denies anorexia, fever, weight loss,, vision loss, decreased hearing, hoarseness, chest pain, syncope, peripheral edema, balance deficits, hemoptysis, abdominal pain, melena, hematochezia, severe indigestion/heartburn, hematuria, incontinence, genital sores, muscle weakness, suspicious skin lesions, transient blindness, difficulty walking, depression, unusual weight change, abnormal bleeding, enlarged lymph nodes, angioedema, and breast masses.   Past Medical History  Diagnosis Date  . History of lumpectomy of both breasts   . Osteoporosis   . Vitamin D deficiency    Past Surgical History  Procedure Laterality Date  . Bilateral oophorectomy  1950  . Abdominal hysterectomy  1950  . Appendectomy     Social History:  reports that she has never smoked. She does not have any smokeless tobacco history on file. She reports that she drinks alcohol. She reports that she does not use illicit drugs.  No Known Allergies  Family History  Problem Relation Age of Onset  . Heart disease Mother   . Heart disease Father     Prior to Admission medications   Medication Sig Start Date End Date Taking? Authorizing Provider  acetaminophen (TYLENOL) 500 MG tablet Take 500 mg by mouth every 6 (six) hours as needed for mild pain or moderate pain.    Yes Historical Provider, MD  Calcium Carb-Cholecalciferol (CALCIUM 600 + D PO) Take 1 tablet by mouth  daily.   Yes Historical Provider, MD  Multiple Vitamins-Minerals (MULTIVITAMIN PO) Take 1 tablet by mouth daily.   Yes Historical Provider, MD  albuterol (PROVENTIL HFA;VENTOLIN HFA) 108 (90 BASE) MCG/ACT inhaler Inhale 2 puffs into the lungs every 6 (six) hours as needed for wheezing or shortness of breath. 06/24/14   Sherlene Shamseresa L Tullo, MD  methocarbamol (ROBAXIN) 500 MG tablet Take 1 tablet (500 mg total) by mouth 3 (three) times daily as needed for muscle spasms. Patient not taking: Reported on 06/24/2014 08/09/13   Novella Oliveaquel M Rey, NP   Physical Exam: Filed Vitals:   07/06/15 1404 07/06/15 1500 07/06/15 1501 07/06/15 1600  BP: 124/64 150/48  92/72  Pulse: 64 85 78 74  Temp: 97.8 F (36.6 C)     TempSrc: Oral     Resp: 18 19 19 20   Height: 5\' 5"  (1.651 m)     Weight: 44.951 kg (99 lb 1.6 oz)     SpO2: 99% 99%  99%     General:  No apparent distress, thin, [place]/AT  Eyes: PERRL, EOMI, no scleral icterus, conjunctiva clear  ENT: moist oropharynx without lesions or exudate, TM's benign, dentition fair  Neck: supple, no lymphadenopathy. No bruits or thyromegaly  Cardiovascular: regular rate with 2/6 systolic murmur. No rubs or gallops; 2+ peripheral pulses, no JVD, no peripheral edema  Respiratory: scattered rhonchi without wheezes or rales. No dullness. Respiratory effort normal  Abdomen: soft, non tender to palpation, positive bowel sounds, no guarding, no rebound, no organomegaly  Skin: no rashes or lesions  Musculoskeletal: normal bulk and tone, no joint swelling  Psychiatric: normal mood and affect, A&OX3  Neurologic: CN 2-12 grossly intact, Motor strength 5/5 in all 4 groups with normal sensory exam and symmetric DTR's.  Labs on Admission:  Basic Metabolic Panel:  Recent Labs Lab 07/06/15 1449  NA 138  K 3.5  CL 104  CO2 26  GLUCOSE 118*  BUN 39*  CREATININE 1.08*  CALCIUM 8.7*   Liver Function Tests:  Recent Labs Lab 07/06/15 1449  AST 45*  ALT 28  ALKPHOS 73   BILITOT 1.0  PROT 6.7  ALBUMIN 3.6   No results for input(s): LIPASE, AMYLASE in the last 168 hours. No results for input(s): AMMONIA in the last 168 hours. CBC:  Recent Labs Lab 07/06/15 1449  WBC 14.4*  HGB 12.4  HCT 37.1  MCV 92.9  PLT 159   Cardiac Enzymes:  Recent Labs Lab 07/06/15 1449  TROPONINI 0.20*    BNP (last 3 results) No results for input(s): BNP in the last 8760 hours.  ProBNP (last 3 results) No results for input(s): PROBNP in the last 8760 hours.  CBG: No results for input(s): GLUCAP in the last 168 hours.  Radiological Exams on Admission: Dg Chest 2 View  07/06/2015  CLINICAL DATA:  Shortness of breath. Worse with exertion. Nonsmoker. EXAM: CHEST  2 VIEW COMPARISON:  06/24/2014 FINDINGS: Osteopenia. Moderate convex right thoracic spine curvature, distorting the appearance of the chest on the frontal radiograph. Midline trachea. Normal heart size. Atherosclerosis in the transverse aorta. No pleural effusion or pneumothorax. Interstitial thickening is moderate, nonspecific in this age group. Left upper lobe scarring is similar. Patchy right upper lobe airspace disease is new. IMPRESSION: Right upper lobe airspace disease, suspicious for pneumonia. Followup PA and lateral chest X-ray is recommended in 3-4 weeks following trial of antibiotic therapy to ensure resolution and exclude underlying malignancy. Chronic interstitial thickening, nonspecific. Aortic atherosclerosis. Electronically Signed   By: Jeronimo Greaves M.D.   On: 07/06/2015 14:48    EKG: Independently reviewed.  Assessment/Plan Principal Problem:   CAP (community acquired pneumonia) Active Problems:   DOE (dyspnea on exertion)   Elevated troponin   Palpitations   Will admit to floor With telemetry, IV fluids, IV ABX, and SVN's. Follow cardiac enzymes and obtain echo and Cardiology consult. Repeat labs and CXR in AM. Monitor oxygenation closely  Diet: regular Fluids: NS@100  DVT  Prophylaxis: SQ Heparin  Code Status: FULL  Family Communication: yes  Disposition Plan: home  Time spent: 50 min

## 2015-07-07 ENCOUNTER — Observation Stay: Payer: PPO

## 2015-07-07 ENCOUNTER — Observation Stay: Admit: 2015-07-07 | Payer: PPO

## 2015-07-07 DIAGNOSIS — R0609 Other forms of dyspnea: Secondary | ICD-10-CM | POA: Diagnosis not present

## 2015-07-07 DIAGNOSIS — R05 Cough: Secondary | ICD-10-CM | POA: Diagnosis not present

## 2015-07-07 DIAGNOSIS — R002 Palpitations: Secondary | ICD-10-CM | POA: Diagnosis not present

## 2015-07-07 DIAGNOSIS — J189 Pneumonia, unspecified organism: Secondary | ICD-10-CM | POA: Diagnosis not present

## 2015-07-07 DIAGNOSIS — R0602 Shortness of breath: Secondary | ICD-10-CM | POA: Diagnosis not present

## 2015-07-07 DIAGNOSIS — R748 Abnormal levels of other serum enzymes: Secondary | ICD-10-CM | POA: Diagnosis not present

## 2015-07-07 LAB — COMPREHENSIVE METABOLIC PANEL
ALK PHOS: 67 U/L (ref 38–126)
ALT: 36 U/L (ref 14–54)
AST: 61 U/L — AB (ref 15–41)
Albumin: 2.8 g/dL — ABNORMAL LOW (ref 3.5–5.0)
Anion gap: 4 — ABNORMAL LOW (ref 5–15)
BUN: 29 mg/dL — AB (ref 6–20)
CALCIUM: 7.9 mg/dL — AB (ref 8.9–10.3)
CHLORIDE: 109 mmol/L (ref 101–111)
CO2: 26 mmol/L (ref 22–32)
CREATININE: 0.88 mg/dL (ref 0.44–1.00)
GFR calc Af Amer: >60 mL/min (ref >60)
GFR calc non Af Amer: 57 mL/min — ABNORMAL LOW (ref >60)
GLUCOSE: 104 mg/dL — AB (ref 65–99)
Potassium: 3.6 mmol/L (ref 3.5–5.1)
SODIUM: 139 mmol/L (ref 135–145)
Total Bilirubin: 0.6 mg/dL (ref 0.3–1.2)
Total Protein: 5.7 g/dL — ABNORMAL LOW (ref 6.5–8.1)

## 2015-07-07 LAB — CBC
HCT: 33.5 % — ABNORMAL LOW (ref 35.0–47.0)
HEMOGLOBIN: 11.4 g/dL — AB (ref 12.0–16.0)
MCH: 31.4 pg (ref 26.0–34.0)
MCHC: 34 g/dL (ref 32.0–36.0)
MCV: 92.5 fL (ref 80.0–100.0)
PLATELETS: 161 10*3/uL (ref 150–440)
RBC: 3.62 MIL/uL — ABNORMAL LOW (ref 3.80–5.20)
RDW: 13.3 % (ref 11.5–14.5)
WBC: 9.4 10*3/uL (ref 3.6–11.0)

## 2015-07-07 LAB — TROPONIN I
TROPONIN I: 0.15 ng/mL — AB (ref <0.031)
Troponin I: 0.12 ng/mL — ABNORMAL HIGH (ref <0.031)

## 2015-07-07 LAB — MAGNESIUM: Magnesium: 2.1 mg/dL (ref 1.7–2.4)

## 2015-07-07 MED ORDER — ENOXAPARIN SODIUM 40 MG/0.4ML ~~LOC~~ SOLN
40.0000 mg | SUBCUTANEOUS | Status: DC
  Administered 2015-07-07: 40 mg via SUBCUTANEOUS
  Filled 2015-07-07: qty 0.4

## 2015-07-07 MED ORDER — IPRATROPIUM-ALBUTEROL 0.5-2.5 (3) MG/3ML IN SOLN
3.0000 mL | Freq: Two times a day (BID) | RESPIRATORY_TRACT | Status: DC
  Administered 2015-07-07 – 2015-07-08 (×2): 3 mL via RESPIRATORY_TRACT
  Filled 2015-07-07 (×2): qty 3

## 2015-07-07 NOTE — Progress Notes (Addendum)
Lab contacted again due to still no troponin drawn. Another page out to lab tech

## 2015-07-07 NOTE — Progress Notes (Signed)
Receiving report from off going nurse at this time, Lab called due to no troponin drawn at 1830. Lab stated troponin rescheduled for 0030. Stated needed to be drawn now since 6 hrs from first baseline draw was to be drawn by at least 2030. Lab stated would notify lab tech to come draw at this time.

## 2015-07-07 NOTE — Progress Notes (Signed)
PT Cancellation Note  Patient Details Name: Courtney Newton MRN: [id number] DOB: [birth year]   Cancelled Treatment:    Reason Eval/Treat Not Completed: Patient not medically ready;Other (comment). Pt's chart reviewed and discussed with RN. She currently has elevated troponin and is pending an echo. Labs are scheduled to be redrawn at 1200. She is currently not appropriate to participate in PT evaluation. PT will f/u at a later time and complete evaluation when appropriate.   Adelene IdlerMindy Jo Tylea Hise, PT, DPT  07/07/2015, 11:20 AM [phone]

## 2015-07-07 NOTE — Progress Notes (Signed)
MD pryreddy notified of pt having 15 beat run of svt per telemetry monitor, now in NSR 66. MD ordered Mag level to be added to am labs

## 2015-07-07 NOTE — Care Management Note (Signed)
Case Management Note  Patient Details  Name: Courtney Newton MRN: [id number] Date of Birth: 04/25/1926  Subjective/Objective:                  Met with patient to discuss discharge planning. Her PCP is Dr. Tullo. She denies difficulty obtaining Rx. She is from condo apartments that are [place] this hospital ([address])- alone. She has been able to drive.   Action/Plan: List of home health agencies left with patient. PT evaluation pending.   Expected Discharge Date:                  Expected Discharge Plan:     In-House Referral:     Discharge planning Services  CM Consult  Post Acute Care Choice:  Home Health, Durable Medical Equipment Choice offered to:  Patient  DME Arranged:    DME Agency:     HH Arranged:    HH Agency:     Status of Service:  In process, will continue to follow  Medicare Important Message Given:    Date Medicare IM Given:    Medicare IM give by:    Date Additional Medicare IM Given:    Additional Medicare Important Message give by:     If discussed at Long Length of Stay Meetings, dates discussed:    Additional Comments:   , RN 07/07/2015, 11:16 AM  

## 2015-07-07 NOTE — Progress Notes (Signed)
[facility] [place] [facility]   PATIENT NAME: Courtney DomMarilyn Spiewak    MR#:  [id number]  DATE OF BIRTH:  Feb 13, 1927  SUBJECTIVE:  CHIEF COMPLAINT:   Chief Complaint  Patient presents with  . Weakness   Still cough, no SOB. REVIEW OF SYSTEMS:  CONSTITUTIONAL: No fever, fatigue or weakness.  EYES: No blurred or double vision.  EARS, NOSE, AND THROAT: No tinnitus or ear pain.  RESPIRATORY: has cough, no shortness of breath, wheezing or hemoptysis.  CARDIOVASCULAR: No chest pain, orthopnea, edema.  GASTROINTESTINAL: No nausea, vomiting, diarrhea or abdominal pain.  GENITOURINARY: No dysuria, hematuria.  ENDOCRINE: No polyuria, nocturia,  HEMATOLOGY: No anemia, easy bruising or bleeding SKIN: No rash or lesion. MUSCULOSKELETAL: No joint pain or arthritis.   NEUROLOGIC: No tingling, numbness, weakness.  PSYCHIATRY: No anxiety or depression.   DRUG ALLERGIES:  No Known Allergies  VITALS:  Blood pressure 122/53, pulse 83, temperature 98.2 F (36.8 C), temperature source Oral, resp. rate 18, height 5\' 5"  (1.651 m), weight 44.725 kg (98 lb 9.6 oz), SpO2 98 %.  PHYSICAL EXAMINATION:  GENERAL:  82 y.o.-year-old patient lying in the bed with no acute distress.  EYES: Pupils equal, round, reactive to light and accommodation. No scleral icterus. Extraocular muscles intact.  HEENT: Head atraumatic, normocephalic. Oropharynx and nasopharynx clear.  NECK:  Supple, no jugular venous distention. No thyroid enlargement, no tenderness.  LUNGS: Normal breath sounds bilaterally, no wheezing, right side crackles. No use of accessory muscles of respiration.  CARDIOVASCULAR: S1, S2 normal. No murmurs, rubs, or gallops.  ABDOMEN: Soft, nontender, nondistended. Bowel sounds present. No organomegaly or mass.  EXTREMITIES: No pedal edema, cyanosis, or clubbing.  NEUROLOGIC: Cranial nerves II through XII are intact. Muscle strength 5/5 in all extremities. Sensation intact. Gait  not checked.  PSYCHIATRIC: The patient is alert and oriented x 3.  SKIN: No obvious rash, lesion, or ulcer.    LABORATORY PANEL:   CBC  Recent Labs Lab 07/07/15 0631  WBC 9.4  HGB 11.4*  HCT 33.5*  PLT 161   ------------------------------------------------------------------------------------------------------------------  Chemistries   Recent Labs Lab 07/07/15 0631  NA 139  K 3.6  CL 109  CO2 26  GLUCOSE 104*  BUN 29*  CREATININE 0.88  CALCIUM 7.9*  MG 2.1  AST 61*  ALT 36  ALKPHOS 67  BILITOT 0.6   ------------------------------------------------------------------------------------------------------------------  Cardiac Enzymes  Recent Labs Lab 07/07/15 0631  TROPONINI 0.12*   ------------------------------------------------------------------------------------------------------------------  RADIOLOGY:  X-ray Chest Pa And Lateral  07/07/2015  CLINICAL DATA:  Cough, shortness of breath. EXAM: CHEST  2 VIEW COMPARISON:  July 06, 2015. FINDINGS: Stable cardiomediastinal silhouette. No pneumothorax is noted. Mild dextroscoliosis of thoracic spine is noted. Left midlung subsegmental atelectasis or scarring is noted. No significant pleural effusion is noted. Stable right upper lobe airspace opacity is noted concerning for pneumonia. Stable chronic interstitial densities are noted throughout both lungs. IMPRESSION: Stable right upper lobe airspace opacity is noted consistent with pneumonia. Follow-up radiographs are recommended in 3-4 weeks following a trial of antibiotic therapy to ensure resolution and exclude underlying neoplasm. Electronically Signed   By: Lupita RaiderJames  Green Jr, M.D.   On: 07/07/2015 08:10   Dg Chest 2 View  07/06/2015  CLINICAL DATA:  Shortness of breath. Worse with exertion. Nonsmoker. EXAM: CHEST  2 VIEW COMPARISON:  06/24/2014 FINDINGS: Osteopenia. Moderate convex right thoracic spine curvature, distorting the appearance of the chest on the frontal  radiograph. Midline trachea. Normal heart size. Atherosclerosis  in the transverse aorta. No pleural effusion or pneumothorax. Interstitial thickening is moderate, nonspecific in this age group. Left upper lobe scarring is similar. Patchy right upper lobe airspace disease is new. IMPRESSION: Right upper lobe airspace disease, suspicious for pneumonia. Followup PA and lateral chest X-ray is recommended in 3-4 weeks following trial of antibiotic therapy to ensure resolution and exclude underlying malignancy. Chronic interstitial thickening, nonspecific. Aortic atherosclerosis. Electronically Signed   By: Jeronimo Greaves M.D.   On: 07/06/2015 14:48    EKG:   Orders placed or performed during the hospital encounter of 07/06/15  . ED EKG  . ED EKG  . EKG 12-Lead  . EKG 12-Lead    ASSESSMENT AND PLAN:   CAP (community acquired pneumonia) Continue zithromax and rocephin, f/u cultures.  Elevated troponin, due to demanding ischemia. Continue ASA, check lipid and f/u cardiology consult. Echo is pending.  All the records are reviewed and case discussed with Care Management/Social Workerr. Management plans discussed with the patient, family and they are in agreement.  CODE STATUS: full code.  TOTAL TIME TAKING CARE OF THIS PATIENT:35 minutes.  Greater than 50% time was spent on coordination of care and face-to-face counseling.  POSSIBLE D/C IN 1-2 DAYS, DEPENDING ON CLINICAL CONDITION.   Shaune Pollack M.D on 07/07/2015 at 4:03 PM  Between 7am to 6pm - Pager - [phone]  After 6pm go to [email or web address] - password [email or web address] [facility]  [place] [facility]  Office  [phone]  CC: Primary care physician; Sherlene Shams, MD

## 2015-07-08 ENCOUNTER — Observation Stay (HOSPITAL_BASED_OUTPATIENT_CLINIC_OR_DEPARTMENT_OTHER)
Admit: 2015-07-08 | Discharge: 2015-07-08 | Disposition: A | Discharge status: Home / Self Care | Payer: PPO | Attending: Internal Medicine | Admitting: Internal Medicine

## 2015-07-08 ENCOUNTER — Telehealth: Payer: Self-pay | Admitting: *Deleted

## 2015-07-08 DIAGNOSIS — I361 Nonrheumatic tricuspid (valve) insufficiency: Secondary | ICD-10-CM | POA: Diagnosis not present

## 2015-07-08 DIAGNOSIS — J189 Pneumonia, unspecified organism: Secondary | ICD-10-CM | POA: Diagnosis not present

## 2015-07-08 DIAGNOSIS — R0609 Other forms of dyspnea: Secondary | ICD-10-CM | POA: Diagnosis not present

## 2015-07-08 DIAGNOSIS — R748 Abnormal levels of other serum enzymes: Secondary | ICD-10-CM | POA: Diagnosis not present

## 2015-07-08 DIAGNOSIS — R002 Palpitations: Secondary | ICD-10-CM | POA: Diagnosis not present

## 2015-07-08 LAB — LIPID PANEL
Cholesterol: 151 mg/dL (ref 0–200)
HDL: 43 mg/dL (ref >40)
LDL Cholesterol: 95 mg/dL (ref 0–99)
TRIGLYCERIDES: 67 mg/dL (ref <150)
Total CHOL/HDL Ratio: 3.5 RATIO
VLDL: 13 mg/dL (ref 0–40)

## 2015-07-08 LAB — ECHOCARDIOGRAM COMPLETE
HEIGHTINCHES: 65 in
WEIGHTICAEL: 1616 [oz_av]

## 2015-07-08 MED ORDER — GUAIFENESIN 100 MG/5ML PO SOLN: 5.0000 mL | ORAL | Status: DC | PRN

## 2015-07-08 MED ORDER — AZITHROMYCIN 500 MG PO TABS: 500.0000 mg | ORAL_TABLET | Freq: Every day | ORAL | Status: DC

## 2015-07-08 MED ORDER — ASPIRIN 81 MG PO TBEC: 81.0000 mg | DELAYED_RELEASE_TABLET | Freq: Every day | ORAL | Status: DC

## 2015-07-08 NOTE — Discharge Instructions (Signed)
Heart healthy diet. °Activity as tolerated. °

## 2015-07-08 NOTE — Progress Notes (Signed)
*  PRELIMINARY RESULTS* Echocardiogram 2D Echocardiogram has been performed.  Courtney HousekeeperJerry R Newton 07/08/2015, 8:40 AM

## 2015-07-08 NOTE — Progress Notes (Signed)
PT Cancellation Note  Patient Details Name: Courtney MinksMarilyn R Demartini MRN: [id number] DOB: Nov 24, 1926   Cancelled Treatment:    Reason Eval/Treat Not Completed: Patient declined, no reason specified. Pt's chart reviewed and discussed with RN. Per RN pt is refusing any PT services at this time. Per Case managers note pt states she is very active and goes to the [employer] (does Pilates). She denies need for DME. Pt orders will be completed and new orders will be required if a need occurs in the future.   Adelene IdlerMindy Jo Dawnna Gritz, PT, DPT  07/08/2015, 10:56 AM [phone]

## 2015-07-08 NOTE — Discharge Summary (Signed)
[facility] [place] at [name]   PATIENT NAME: Courtney Newton    MR#:  [id number]  DATE OF BIRTH:  06-16-1926  DATE OF ADMISSION:  07/06/2015 ADMITTING PHYSICIAN: Marguarite ArbourJeffrey D Sparks, MD  DATE OF DISCHARGE: 07/08/2015  2:35 PM  PRIMARY CARE PHYSICIAN: Sherlene ShamsULLO, TERESA L, MD    ADMISSION DIAGNOSIS:  Community acquired pneumonia [J18.9] Elevated troponin [R79.89]   DISCHARGE DIAGNOSIS:   community acquired pneumonia Elevated troponin SECONDARY DIAGNOSIS:   Past Medical History  Diagnosis Date  . History of lumpectomy of both breasts   . Osteoporosis   . Vitamin D deficiency     HOSPITAL COURSE:   CAP (community acquired pneumonia) She was treated with zithromax and rocephin, negative blood cultures. Continue po zithromax.  Elevated troponin, due to demanding ischemia. Continue ASA,  normal lipid. Echo: 50% - 55%.   DISCHARGE CONDITIONS:   Stable, discharged to home today.  CONSULTS OBTAINED:     DRUG ALLERGIES:  No Known Allergies  DISCHARGE MEDICATIONS:   Discharge Medication List as of 07/08/2015 11:34 AM    START taking these medications   Details  aspirin EC 81 MG EC tablet Take 1 tablet (81 mg total) by mouth daily., Starting 07/08/2015, Until Discontinued, Print    azithromycin (ZITHROMAX) 500 MG tablet Take 1 tablet (500 mg total) by mouth daily., Starting 07/08/2015, Until Discontinued, Print    guaiFENesin (ROBITUSSIN) 100 MG/5ML SOLN Take 5 mLs (100 mg total) by mouth every 4 (four) hours as needed for cough or to loosen phlegm., Starting 07/08/2015, Until Discontinued, Print      CONTINUE these medications which have NOT CHANGED   Details  acetaminophen (TYLENOL) 500 MG tablet Take 500 mg by mouth every 6 (six) hours as needed for mild pain or moderate pain. , Until Discontinued, Historical Med    Calcium Carb-Cholecalciferol (CALCIUM 600 + D PO) Take 1 tablet by mouth daily., Until Discontinued, Historical Med     Multiple Vitamins-Minerals (MULTIVITAMIN PO) Take 1 tablet by mouth daily., Until Discontinued, Historical Med    albuterol (PROVENTIL HFA;VENTOLIN HFA) 108 (90 BASE) MCG/ACT inhaler Inhale 2 puffs into the lungs every 6 (six) hours as needed for wheezing or shortness of breath., Starting 06/24/2014, Until Discontinued, Normal    methocarbamol (ROBAXIN) 500 MG tablet Take 1 tablet (500 mg total) by mouth 3 (three) times daily as needed for muscle spasms., Starting 08/09/2013, Until Discontinued, Normal         DISCHARGE INSTRUCTIONS:    If you experience worsening of your admission symptoms, develop shortness of breath, life threatening emergency, suicidal or homicidal thoughts you must seek medical attention immediately by calling 911 or calling your MD immediately  if symptoms less severe.  You Must read complete instructions/literature along with all the possible adverse reactions/side effects for all the Medicines you take and that have been prescribed to you. Take any new Medicines after you have completely understood and accept all the possible adverse reactions/side effects.   Please note  You were cared for by a hospitalist during your hospital stay. If you have any questions about your discharge medications or the care you received while you were in the hospital after you are discharged, you can call the unit and asked to speak with the hospitalist on call if the hospitalist that took care of you is not available. Once you are discharged, your primary care physician will handle any further medical issues. Please note that NO REFILLS for any discharge medications  will be authorized once you are discharged, as it is imperative that you return to your primary care physician (or establish a relationship with a primary care physician if you do not have one) for your aftercare needs so that they can reassess your need for medications and monitor your lab values.    Today   SUBJECTIVE     No complaint  VITAL SIGNS:  Blood pressure 94/79, pulse 63, temperature 98.4 F (36.9 C), temperature source Oral, resp. rate 18, height  (1.651 m), weight 45.813 kg (101 lb), SpO2 92 %.  I/O:   Intake/Output Summary (Last 24 hours) at 07/08/15 1726 Last data filed at 07/08/15 0817  Gross per 24 hour  Intake    123 ml  Output    200 ml  Net    -77 ml    PHYSICAL EXAMINATION:  GENERAL:  82 y.o.-year-old patient lying in the bed with no acute distress.  EYES: Pupils equal, round, reactive to light and accommodation. No scleral icterus. Extraocular muscles intact.  HEENT: Head atraumatic, normocephalic. Oropharynx and nasopharynx clear.  NECK:  Supple, no jugular venous distention. No thyroid enlargement, no tenderness.  LUNGS: Normal breath sounds bilaterally, no wheezing, rales,rhonchi or crepitation. No use of accessory muscles of respiration.  CARDIOVASCULAR: S1, S2 normal. No murmurs, rubs, or gallops.  ABDOMEN: Soft, non-tender, non-distended. Bowel sounds present. No organomegaly or mass.  EXTREMITIES: No pedal edema, cyanosis, or clubbing.  NEUROLOGIC: Cranial nerves II through XII are intact. Muscle strength 5/5 in all extremities. Sensation intact. Gait not checked.  PSYCHIATRIC: The patient is alert and oriented x 3.  SKIN: No obvious rash, lesion, or ulcer.   DATA REVIEW:   CBC  Recent Labs Lab 07/07/15 0631  WBC 9.4  HGB 11.4*  HCT 33.5*  PLT 161    Chemistries   Recent Labs Lab 07/07/15 0631  NA 139  K 3.6  CL 109  CO2 26  GLUCOSE 104*  BUN 29*  CREATININE 0.88  CALCIUM 7.9*  MG 2.1  AST 61*  ALT 36  ALKPHOS 67  BILITOT 0.6    Cardiac Enzymes  Recent Labs Lab 07/07/15 0631  TROPONINI 0.12*    Microbiology Results  Results for orders placed or performed during the hospital encounter of 07/06/15  Blood culture (routine x 2)     Status: None (Preliminary result)   Collection Time: 07/06/15  4:29 PM  Result Value Ref Range  Status   Specimen Description BLOOD LEFT FOREARM  Final   Special Requests BOTTLES DRAWN AEROBIC AND ANAEROBIC  1CC  Final   Culture NO GROWTH 2 DAYS  Final   Report Status PENDING  Incomplete  Blood culture (routine x 2)     Status: None (Preliminary result)   Collection Time: 07/06/15  4:29 PM  Result Value Ref Range Status   Specimen Description BLOOD RIGHT FOREARM  Final   Special Requests BOTTLES DRAWN AEROBIC AND ANAEROBIC  1CC  Final   Culture NO GROWTH 2 DAYS  Final   Report Status PENDING  Incomplete    RADIOLOGY:  X-ray Chest Pa And Lateral  07/07/2015  CLINICAL DATA:  Cough, shortness of breath. EXAM: CHEST  2 VIEW COMPARISON:  July 06, 2015. FINDINGS: Stable cardiomediastinal silhouette. No pneumothorax is noted. Mild dextroscoliosis of thoracic spine is noted. Left midlung subsegmental atelectasis or scarring is noted. No significant pleural effusion is noted. Stable right upper lobe airspace opacity is noted concerning for pneumonia. Stable chronic interstitial densities are  noted throughout both lungs. IMPRESSION: Stable right upper lobe airspace opacity is noted consistent with pneumonia. Follow-up radiographs are recommended in 3-4 weeks following a trial of antibiotic therapy to ensure resolution and exclude underlying neoplasm. Electronically Signed   By: Lupita Raider, M.D.   On: 07/07/2015 08:10        Management plans discussed with the patient, family and they are in agreement.  CODE STATUS:     Code Status Orders        Start     Ordered   07/06/15 1830  Full code   Continuous     07/06/15 1829    Code Status History    Date Active Date Inactive Code Status Order ID Comments User Context   This patient has a current code status but no historical code status.      TOTAL TIME TAKING CARE OF THIS PATIENT: 28 minutes.    Shaune Pollack M.D on 07/08/2015 at 5:26 PM  Between 7am to 6pm - Pager - [phone]  After 6pm go to [email or web address] - password  [facility]  [facility]  Office  [phone]  CC: Primary care physician; Sherlene Shams, MD

## 2015-07-08 NOTE — Discharge Planning (Signed)
Pt IV x2 removed.  Pt DC papers given, explained and educated.  Pt told that Dr. Isidore Moosffice will call to set up FU appt.  Pt also given scripts.  RN assessmenta nd VS revealed stability for DC to home.  Pt currently refusing PT at home.  Pt will be wheeled to front and friend transporting home via car.

## 2015-07-08 NOTE — Telephone Encounter (Signed)
[facility]. Please advise a place on Dr. Darrick Huntsmanullo schedule to place patient. She was admitted for metabolic confusion.

## 2015-07-08 NOTE — Care Management (Signed)
Met with patient again. She states she does not feel that she will need HHPT. She states she is very active and goes to the [facility] (does Pilates). She denies need for DME.Her friend has the key to her house and patient does not have friends contact number. Patient knows she is being discharged and states that her friend will be calling her soon.  No further RNCM needs. Case closed.

## 2015-07-09 ENCOUNTER — Telehealth: Payer: Self-pay

## 2015-07-09 NOTE — Telephone Encounter (Signed)
Thank you. I will view schedule and follow up as appropriate.

## 2015-07-09 NOTE — Telephone Encounter (Signed)
Transition Care Management Follow-up Telephone Call   Date discharged? 07/08/15   How have you been since you were released from the hospital? Some SOB with very little cough.  No wheezing.  Eating/drinking ok.  Resting well.   Do you understand why you were in the hospital? YES, I have pneumonia.   Do you understand the discharge instructions? YES, increasing activity as tolerated and following up with my primary care provider.   Where were you discharged to? Home.   Items Reviewed:  Medications reviewed: YES, started taking aspirin 81mg , Zithromax 500mg , Robitussin 100mg  without issues.  Continuing all other scheduled medications as directed.  Allergies reviewed: YES, no known allergies.  Dietary changes reviewed: YES, regular diet, no problems.  Referrals reviewed: YES, appointment scheduled with PCP.   Functional Questionnaire:   Activities of Daily Living (ADLs):   She states they are independent in the following: Independent in all ADLs. States they require assistance with the following: Does not require assistance at this time.   Any transportation issues/concerns?: NO, able to drive self without assistance.   Any patient concerns? NO, not at this time.   Confirmed importance and date/time of follow-up visits scheduled YES, appointment scheduled 07/11/15 at 8:00.  Provider Appointment booked with Dr. Darrick Huntsmanullo (PCP).  Confirmed with patient if condition begins to worsen call PCP or go to the ER.  Patient was given the office number and encouraged to call back with question or concerns.  : YES, patient verbalized understanding.

## 2015-07-11 ENCOUNTER — Ambulatory Visit (INDEPENDENT_AMBULATORY_CARE_PROVIDER_SITE_OTHER): Payer: PPO | Admitting: Internal Medicine

## 2015-07-11 ENCOUNTER — Encounter: Payer: Self-pay | Admitting: Internal Medicine

## 2015-07-11 VITALS — BP 138/70 | HR 64 | Temp 97.5°F | Resp 12 | Ht 63.5 in | Wt 101.2 lb

## 2015-07-11 DIAGNOSIS — R0689 Other abnormalities of breathing: Secondary | ICD-10-CM

## 2015-07-11 DIAGNOSIS — D509 Iron deficiency anemia, unspecified: Secondary | ICD-10-CM | POA: Diagnosis not present

## 2015-07-11 DIAGNOSIS — D519 Vitamin B12 deficiency anemia, unspecified: Secondary | ICD-10-CM | POA: Diagnosis not present

## 2015-07-11 DIAGNOSIS — J189 Pneumonia, unspecified organism: Secondary | ICD-10-CM

## 2015-07-11 DIAGNOSIS — Z09 Encounter for follow-up examination after completed treatment for conditions other than malignant neoplasm: Secondary | ICD-10-CM

## 2015-07-11 DIAGNOSIS — R0600 Dyspnea, unspecified: Secondary | ICD-10-CM | POA: Diagnosis not present

## 2015-07-11 DIAGNOSIS — Z1239 Encounter for other screening for malignant neoplasm of breast: Secondary | ICD-10-CM

## 2015-07-11 DIAGNOSIS — E46 Unspecified protein-calorie malnutrition: Secondary | ICD-10-CM

## 2015-07-11 DIAGNOSIS — R4189 Other symptoms and signs involving cognitive functions and awareness: Secondary | ICD-10-CM

## 2015-07-11 LAB — COMPREHENSIVE METABOLIC PANEL
ALT: 36 U/L — ABNORMAL HIGH (ref 0–35)
AST: 27 U/L (ref 0–37)
Albumin: 3.6 g/dL (ref 3.5–5.2)
Alkaline Phosphatase: 68 U/L (ref 39–117)
BUN: 21 mg/dL (ref 6–23)
CALCIUM: 9.7 mg/dL (ref 8.4–10.5)
CHLORIDE: 104 meq/L (ref 96–112)
CO2: 32 meq/L (ref 19–32)
Creatinine, Ser: 0.84 mg/dL (ref 0.40–1.20)
GFR: 68.14 mL/min (ref >60.00)
Glucose, Bld: 85 mg/dL (ref 70–99)
Potassium: 4.1 mEq/L (ref 3.5–5.1)
SODIUM: 143 meq/L (ref 135–145)
Total Bilirubin: 0.3 mg/dL (ref 0.2–1.2)
Total Protein: 6.9 g/dL (ref 6.0–8.3)

## 2015-07-11 LAB — CULTURE, BLOOD (ROUTINE X 2)
CULTURE: NO GROWTH
Culture: NO GROWTH

## 2015-07-11 LAB — VITAMIN B12: Vitamin B-12: 1492 pg/mL — ABNORMAL HIGH (ref 211–911)

## 2015-07-11 LAB — IRON AND TIBC
%SAT: 16 % (ref 11–50)
Iron: 38 ug/dL — ABNORMAL LOW (ref 45–160)
TIBC: 244 ug/dL — ABNORMAL LOW (ref 250–450)
UIBC: 206 ug/dL (ref 125–400)

## 2015-07-11 MED ORDER — ALBUTEROL SULFATE HFA 108 (90 BASE) MCG/ACT IN AERS: 2.0000 | INHALATION_SPRAY | Freq: Four times a day (QID) | RESPIRATORY_TRACT | Status: DC | PRN

## 2015-07-11 NOTE — Patient Instructions (Addendum)
You were treated for pneumonia with an antibiotic called Azithromycin .   Please take a probiotic ( Align, Flora que or MattelCulturelle),  Or a  generic version of one of these  For a minimum of 3 weeks to prevent a serious antibiotic associated diarrhea  Called clostridium dificile colitis.   WE will repeat your chest xray at the end of May   Finish your antibiotics   The other medication (A LBUTEROL MDI) are to be used ONLY IF NEEDED ACCORDING TO DIRECTIONS.    I have ordered your mammogram   I'm going to have you return in 6 weeks , to make sure you get your x ray   Your protein stores are DOWN.  Please drink at least one Ensure  Every day when you are not hungry enough for a meal.

## 2015-07-11 NOTE — Assessment & Plan Note (Addendum)
clinicallly resolved will repeat chest ray at the end of May to ensure resolution of lobar pneumonia.

## 2015-07-11 NOTE — Progress Notes (Signed)
Subjective:  Patient ID: Courtney Newton, female    DOB: [birth year]  Age: 82 y.o. MRN: [id number]  CC: The primary encounter diagnosis was CAP (community acquired pneumonia). Diagnoses of Breast cancer screening, Dyspnea and respiratory abnormality, Protein malnutrition ([place]), Iron deficiency anemia, Anemia, B12 deficiency, Cognitive changes, and Hospital discharge follow-up were also pertinent to this visit.  HPI Courtney Newton presents for hospital follow up. Admitted with CAP n Apirl 16th , was hypotensive and dyspneic.  Blood cultures negtaive,  tropinin was elevated,  ECHO was normal  demnad inschemi diagnosed, protein stores down.  Patient does not remember having CXR or ECHO   Outpatient Prescriptions Prior to Visit  Medication Sig Dispense Refill  . acetaminophen (TYLENOL) 500 MG tablet Take 500 mg by mouth every 6 (six) hours as needed for mild pain or moderate pain.     Marland Kitchen aspirin EC 81 MG EC tablet Take 1 tablet (81 mg total) by mouth daily. 30 tablet 2  . azithromycin (ZITHROMAX) 500 MG tablet Take 1 tablet (500 mg total) by mouth daily. 5 tablet 0  . Calcium Carb-Cholecalciferol (CALCIUM 600 + D PO) Take 1 tablet by mouth daily.    Marland Kitchen guaiFENesin (ROBITUSSIN) 100 MG/5ML SOLN Take 5 mLs (100 mg total) by mouth every 4 (four) hours as needed for cough or to loosen phlegm. 118 mL 0  . Multiple Vitamins-Minerals (MULTIVITAMIN PO) Take 1 tablet by mouth daily.    Marland Kitchen albuterol (PROVENTIL HFA;VENTOLIN HFA) 108 (90 BASE) MCG/ACT inhaler Inhale 2 puffs into the lungs every 6 (six) hours as needed for wheezing or shortness of breath. 1 Inhaler 2  . methocarbamol (ROBAXIN) 500 MG tablet Take 1 tablet (500 mg total) by mouth 3 (three) times daily as needed for muscle spasms. (Patient not taking: Reported on 07/11/2015) 30 tablet 0   Facility-Administered Medications Prior to Visit  Medication Dose Route Frequency Provider Last Rate Last Dose  . pneumococcal 23 valent vaccine (PNU-IMMUNE)  injection 0.5 mL  0.5 mL Intramuscular Once Crecencio Mc, MD        Review of Systems;  Patient denies headache, fevers, malaise, unintentional weight loss, skin rash, eye pain, sinus congestion and sinus pain, sore throat, dysphagia,  hemoptysis , cough, dyspnea, wheezing, chest pain, palpitations, orthopnea, edema, abdominal pain, nausea, melena, diarrhea, constipation, flank pain, dysuria, hematuria, urinary  Frequency, nocturia, numbness, tingling, seizures,  Focal weakness, Loss of consciousness,  Tremor, insomnia, depression, anxiety, and suicidal ideation.      Objective:  BP 138/70 mmHg  Pulse 64  Temp(Src) 97.5 F (36.4 C) (Oral)  Resp 12  Ht 5' 3.5" (1.613 m)  Wt 101 lb 4 oz (45.927 kg)  BMI 17.65 kg/m2  SpO2 98%  BP Readings from Last 3 Encounters:  07/11/15 138/70  07/08/15 94/79  06/24/14 138/68    Wt Readings from Last 3 Encounters:  07/11/15 101 lb 4 oz (45.927 kg)  07/08/15 101 lb (45.813 kg)  06/24/14 103 lb (46.72 kg)    General appearance: alert, cooperative and appears stated age Ears: normal TM's and external ear canals both ears Throat: lips, mucosa, and tongue normal; teeth and gums normal Neck: no adenopathy, no carotid bruit, supple, symmetrical, trachea midline and thyroid not enlarged, symmetric, no tenderness/mass/nodules Back: symmetric, no curvature. ROM normal. No CVA tenderness. Lungs: clear to auscultation bilaterally Heart: regular rate and rhythm, S1, S2 normal, no murmur, click, rub or gallop Abdomen: soft, non-tender; bowel sounds normal; no masses,  no organomegaly Pulses:  2+ and symmetric Skin: Skin color, texture, turgor normal. No rashes or lesions Lymph nodes: Cervical, supraclavicular, and axillary nodes normal.  No results found for: HGBA1C  Lab Results  Component Value Date   CREATININE 0.84 07/11/2015   CREATININE 0.88 07/07/2015   CREATININE 1.08* 07/06/2015    Lab Results  Component Value Date   WBC 9.4  07/07/2015   HGB 11.4* 07/07/2015   HCT 33.5* 07/07/2015   PLT 161 07/07/2015   GLUCOSE 85 07/11/2015   CHOL 151 07/08/2015   TRIG 67 07/08/2015   HDL 43 07/08/2015   LDLDIRECT 137.8 04/06/2012   LDLCALC 95 07/08/2015   ALT 36* 07/11/2015   AST 27 07/11/2015   NA 143 07/11/2015   K 4.1 07/11/2015   CL 104 07/11/2015   CREATININE 0.84 07/11/2015   BUN 21 07/11/2015   CO2 32 07/11/2015   TSH 6.86* 06/24/2014    No results found.  Assessment & Plan:   Problem List Items Addressed This Visit    CAP (community acquired pneumonia) - Primary    clinicallly resolved will repeat chest ray at the end of May to ensure resolution of lobar pneumonia.       Relevant Medications   albuterol (PROVENTIL HFA;VENTOLIN HFA) 108 (90 Base) MCG/ACT inhaler   Other Relevant Orders   DG Chest 2 View   Cognitive changes    She was noted to have significant short term memory issues today specifically the events of her recent hospitalization          Hospital discharge follow-up    All labs , imaging studies and progress notes from admission were reviewed with patient today       Dyspnea and respiratory abnormality   Relevant Medications   albuterol (PROVENTIL HFA;VENTOLIN HFA) 108 (90 Base) MCG/ACT inhaler    Other Visit Diagnoses    Breast cancer screening        Relevant Orders    MM DIGITAL SCREENING BILATERAL    Protein malnutrition ([place])        Relevant Orders    Comp Met (CMET) (Completed)    Prealbumin    Iron deficiency anemia        Relevant Orders    Iron and TIBC    Anemia, B12 deficiency        Relevant Orders    B12 (Completed)       I am having Ms. Meth maintain her Multiple Vitamins-Minerals (MULTIVITAMIN PO), acetaminophen, methocarbamol, Calcium Carb-Cholecalciferol (CALCIUM 600 + D PO), azithromycin, guaiFENesin, aspirin, and albuterol. We will continue to administer pneumococcal 23 valent vaccine.  Meds ordered this encounter  Medications  . albuterol  (PROVENTIL HFA;VENTOLIN HFA) 108 (90 Base) MCG/ACT inhaler    Sig: Inhale 2 puffs into the lungs every 6 (six) hours as needed for wheezing or shortness of breath.    Dispense:  1 Inhaler    Refill:  2    Medications Discontinued During This Encounter  Medication Reason  . albuterol (PROVENTIL HFA;VENTOLIN HFA) 108 (90 BASE) MCG/ACT inhaler Reorder    Follow-up: Return in about 6 weeks (around 08/22/2015) for 30 minutes,  wellness .   Crecencio Mc, MD

## 2015-07-11 NOTE — Progress Notes (Signed)
Pre-visit discussion using our clinic review tool. No additional management support is needed unless otherwise documented below in the visit note.  

## 2015-07-12 DIAGNOSIS — R4189 Other symptoms and signs involving cognitive functions and awareness: Secondary | ICD-10-CM | POA: Insufficient documentation

## 2015-07-12 DIAGNOSIS — Z09 Encounter for follow-up examination after completed treatment for conditions other than malignant neoplasm: Secondary | ICD-10-CM | POA: Insufficient documentation

## 2015-07-12 NOTE — Assessment & Plan Note (Signed)
She was noted to have significant short term memory issues today specifically the events of her recent hospitalization

## 2015-07-12 NOTE — Assessment & Plan Note (Signed)
All labs , imaging studies and progress notes from admission were reviewed with patient today  

## 2015-07-14 LAB — PREALBUMIN: PREALBUMIN: 12 mg/dL — AB (ref 17–34)

## 2015-07-16 ENCOUNTER — Encounter: Payer: Self-pay | Admitting: Internal Medicine

## 2015-07-31 NOTE — Telephone Encounter (Signed)
Mailed unread message to patient.  

## 2015-08-25 ENCOUNTER — Ambulatory Visit (INDEPENDENT_AMBULATORY_CARE_PROVIDER_SITE_OTHER): Payer: PPO

## 2015-08-25 ENCOUNTER — Other Ambulatory Visit: Payer: Self-pay | Admitting: Internal Medicine

## 2015-08-25 VITALS — BP 136/82 | HR 85 | Temp 97.1°F | Resp 14 | Ht 63.0 in | Wt 100.1 lb

## 2015-08-25 DIAGNOSIS — Z0000 Encounter for general adult medical examination without abnormal findings: Secondary | ICD-10-CM | POA: Diagnosis not present

## 2015-08-25 DIAGNOSIS — Z8701 Personal history of pneumonia (recurrent): Secondary | ICD-10-CM

## 2015-08-25 NOTE — Progress Notes (Signed)
  I have reviewed the above information and agree with above.   Malaia Buchta, MD 

## 2015-08-25 NOTE — Patient Instructions (Addendum)
  [name] , Thank you for taking time to come for your Medicare Wellness Visit. I appreciate your ongoing commitment to your health goals. Please review the following plan we discussed and let me know if I can assist you in the future.   Follow up with Dr. Darrick Huntsmanullo as needed.  WE WILL CALL YOU REGARDING REPEAT CHEST XRAY.  BRING COPY OF LIVING WILL/HEALTH CARE POWER OF ATTORNEY TO BE SCANNED INTO YOUR CHART.  RETURN IN September FOR FOLLOW UP APPOINTMENT.   This is a list of the screening recommended for you and due dates:  Health Maintenance  Topic Date Due  . Flu Shot  10/21/2015  . Tetanus Vaccine  07/20/2022  . DEXA scan (bone density measurement)  Completed  . Shingles Vaccine  Completed  . Pneumonia vaccines  Completed

## 2015-08-25 NOTE — Progress Notes (Signed)
Subjective:   Courtney MinksMarilyn R Newton is a 82 y.o. female who presents for Medicare Annual (Subsequent) preventive examination.  Review of Systems:  No ROS.  Medicare Wellness Visit.  Cardiac Risk Factors include: advanced age (>5255men, 2>65 women)     Objective:     Vitals: BP 136/82 mmHg  Pulse 85  Temp(Src) 97.1 F (36.2 C) (Oral)  Resp 14  Ht 5\' 3"  (1.6 m)  Wt 100 lb 1.9 oz (45.414 kg)  BMI 17.74 kg/m2  SpO2 99%  Body mass index is 17.74 kg/(m^2).   Tobacco History  Smoking status  . Never Smoker   Smokeless tobacco  . Not on file     Counseling given: Not Answered   Past Medical History  Diagnosis Date  . History of lumpectomy of both breasts   . Osteoporosis   . Vitamin D deficiency    Past Surgical History  Procedure Laterality Date  . Bilateral oophorectomy  1950  . Abdominal hysterectomy  1950  . Appendectomy     Family History  Problem Relation Age of Onset  . Heart disease Mother   . Heart disease Father    History  Sexual Activity  . Sexual Activity: No    Outpatient Encounter Prescriptions as of 08/25/2015  Medication Sig  . Calcium Carb-Cholecalciferol (CALCIUM 600 + D PO) Take 1 tablet by mouth daily.  . Multiple Vitamins-Minerals (MULTIVITAMIN PO) Take 1 tablet by mouth daily.  . [DISCONTINUED] azithromycin (ZITHROMAX) 500 MG tablet Take 1 tablet (500 mg total) by mouth daily.  . [DISCONTINUED] guaiFENesin (ROBITUSSIN) 100 MG/5ML SOLN Take 5 mLs (100 mg total) by mouth every 4 (four) hours as needed for cough or to loosen phlegm.  . [DISCONTINUED] methocarbamol (ROBAXIN) 500 MG tablet Take 1 tablet (500 mg total) by mouth 3 (three) times daily as needed for muscle spasms.  Marland Kitchen. acetaminophen (TYLENOL) 500 MG tablet Take 500 mg by mouth every 6 (six) hours as needed for mild pain or moderate pain. Reported on 08/25/2015  . albuterol (PROVENTIL HFA;VENTOLIN HFA) 108 (90 Base) MCG/ACT inhaler Inhale 2 puffs into the lungs every 6 (six) hours as needed  for wheezing or shortness of breath. (Patient not taking: Reported on 08/25/2015)  . aspirin EC 81 MG EC tablet Take 1 tablet (81 mg total) by mouth daily. (Patient not taking: Reported on 08/25/2015)   Facility-Administered Encounter Medications as of 08/25/2015  Medication  . pneumococcal 23 valent vaccine (PNU-IMMUNE) injection 0.5 mL    Activities of Daily Living In your present state of health, do you have any difficulty performing the following activities: 08/25/2015 07/06/2015  Hearing? Y N  Vision? Y N  Difficulty concentrating or making decisions? Y N  Walking or climbing stairs? Y N  Dressing or bathing? N N  Doing errands, shopping? N N  Preparing Food and eating ? N -  Using the Toilet? N -  In the past six months, have you accidently leaked urine? N -  Do you have problems with loss of bowel control? N -  Managing your Medications? N -  Managing your Finances? N -  Housekeeping or managing your Housekeeping? N -    Patient Care Team: Sherlene Shamseresa L Tullo, MD as PCP - General (Internal Medicine)    Assessment:   This is a routine wellness examination for Courtney BabinskiMarilyn. The goal of the wellness visit is to assist the patient how to close the gaps in care and create a preventative care plan for the patient.  Taking calcium as appropriate/Osteoporosis reviewed.  Medications reviewed; taking without issues or barriers.  Safety issues reviewed; smoke detectors in the home. No firearms in the home. Wears seatbelts when driving or riding with others. No violence in the home.  No identified risk were noted; The patient was oriented x 3; appropriate in dress and manner and no objective failures at ADL's or IADL's.   Health maintenance gaps; closed.  Patient Concerns:  Need for repeat chest xray; deferred to follow up with PCP.  Exercise Activities and Dietary recommendations Current Exercise Habits: Home exercise routine, Type of exercise: walking, Time (Minutes): 20, Intensity:  Mild  Goals    . Healthy Lifestyle     Stay hydrated and drink plenty of fluids. Healthy foods. Continue drinking BOOST/ENSURE daily as directed by Dr. Darrick Huntsman. Stay active and continue walking for exercise.        Fall Risk Fall Risk  08/25/2015 06/24/2014 05/07/2014 07/17/2012  Falls in the past year? No No No No   Depression Screen PHQ 2/9 Scores 08/25/2015 06/24/2014 05/07/2014 07/17/2012  PHQ - 2 Score 0 0 0 0     Cognitive Testing MMSE - Mini Mental State Exam 08/25/2015  Orientation to time 5  Orientation to Place 5  Registration 3  Attention/ Calculation 5  Recall 3  Language- name 2 objects 2  Language- repeat 1  Language- follow 3 step command 3  Language- read & follow direction 1  Write a sentence 1  Copy design 1  Total score 30    Immunization History  Administered Date(s) Administered  . Influenza Split 01/29/2012  . Influenza,inj,Quad PF,36+ Mos 01/01/2014  . Influenza-Unspecified 12/20/2012  . Pneumococcal Conjugate-13 07/04/2014  . Pneumococcal Polysaccharide-23 03/30/2012  . Tdap 07/19/2012  . Zoster 07/19/2012   Screening Tests Health Maintenance  Topic Date Due  . INFLUENZA VACCINE  10/21/2015  . TETANUS/TDAP  07/20/2022  . DEXA SCAN  Completed  . ZOSTAVAX  Completed  . PNA vac Low Risk Adult  Completed      Plan:   End of life planning; Advance aging; Advanced directives discussed. Copy of current HCPOA/Living Will requested.   During the course of the visit the patient was educated and counseled about the following appropriate screening and preventive services:   Vaccines to include Pneumoccal, Influenza, Hepatitis B, Td, Zostavax, HCV  Electrocardiogram  Cardiovascular Disease  Colorectal cancer screening  Bone density screening  Diabetes screening  Glaucoma screening  Mammography/PAP  Nutrition counseling   Patient Instructions (the written plan) was given to the patient.   Ashok Pall, LPN  03/27/1094

## 2015-08-28 NOTE — Progress Notes (Signed)
  I have reviewed the above information and agree with above.   Jonda Alanis, MD 

## 2015-09-05 ENCOUNTER — Telehealth: Payer: Self-pay

## 2015-09-05 NOTE — Telephone Encounter (Signed)
Patient came to be seen for Right great toe injury.  Patients nail is still attached no signs of infection. Instructed patient to keep well cleaned, dry and bandaged.. Also informed that the nail may fall off on its on. Patient understood and had no questions, comments, or concerns at this time. Advised to be seen if sx change or worsen.

## 2015-10-13 IMAGING — CR DG CERVICAL SPINE COMPLETE 4+V
5 series · 5 of 5 positions shown · non-contrast
Comparison: None.

CLINICAL DATA: Neck pain

EXAM:
CERVICAL SPINE  4+ VIEWS

[view not recorded (1 of 5)]
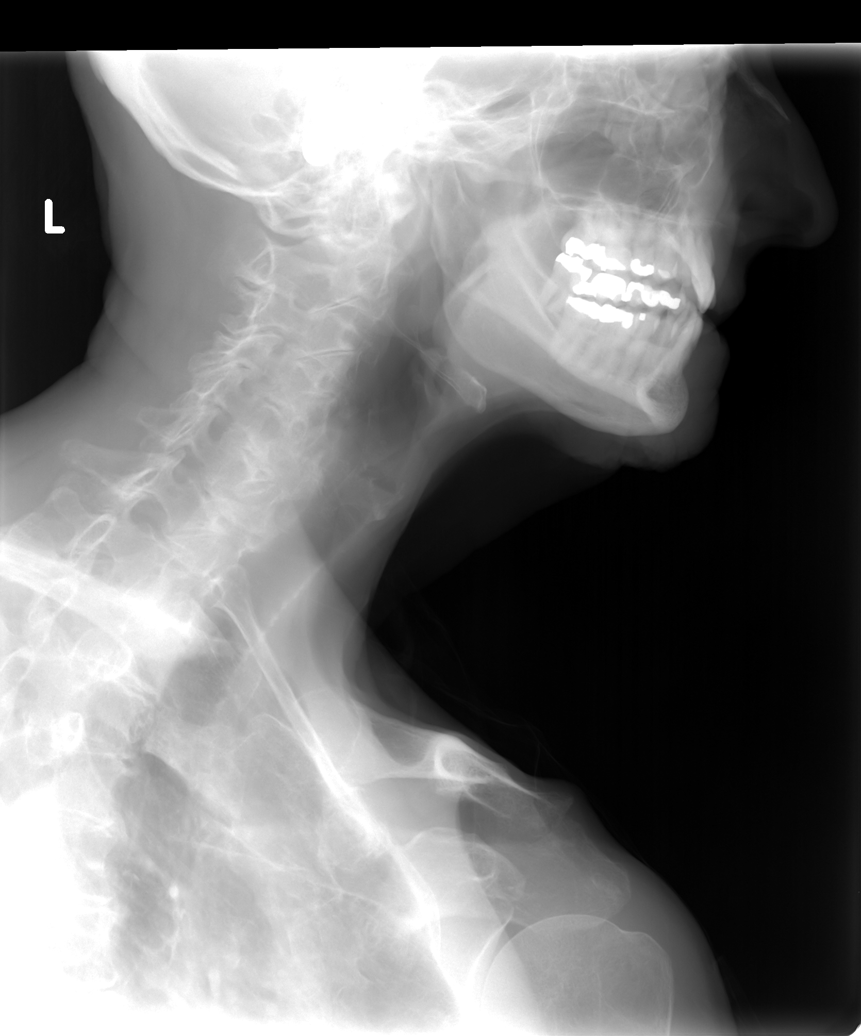

[view not recorded (2 of 5)]
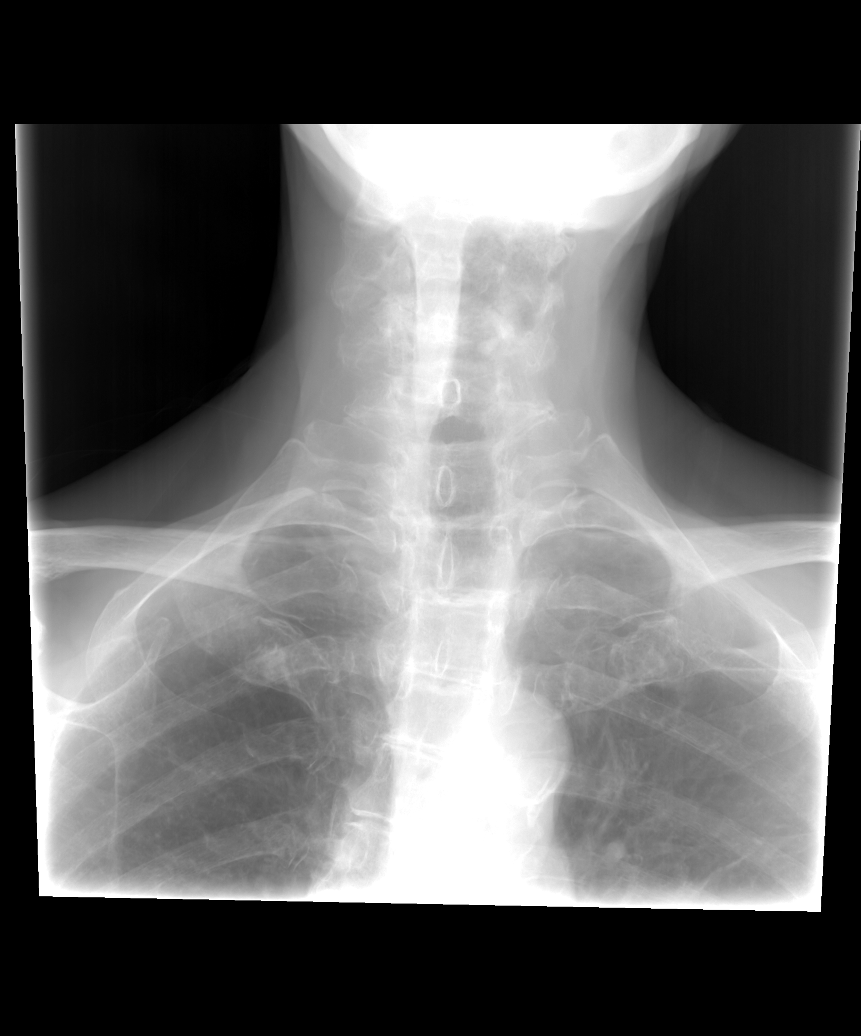

[view not recorded (3 of 5)]
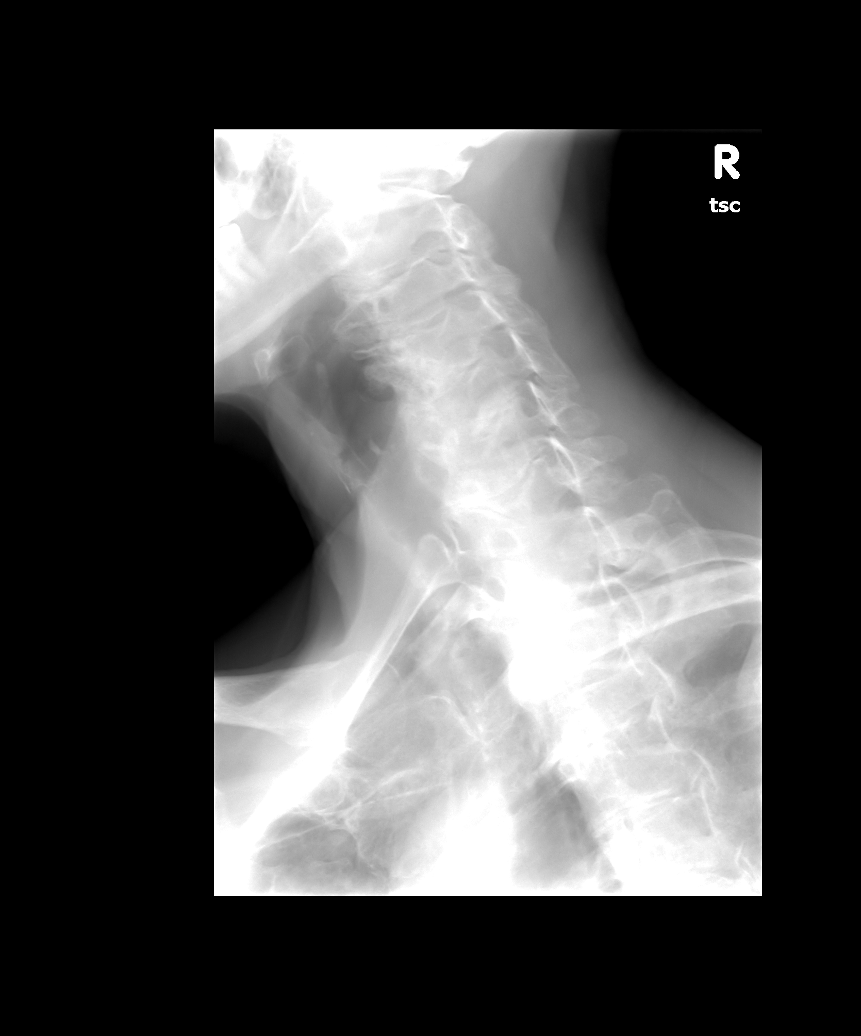

[view not recorded (4 of 5)]
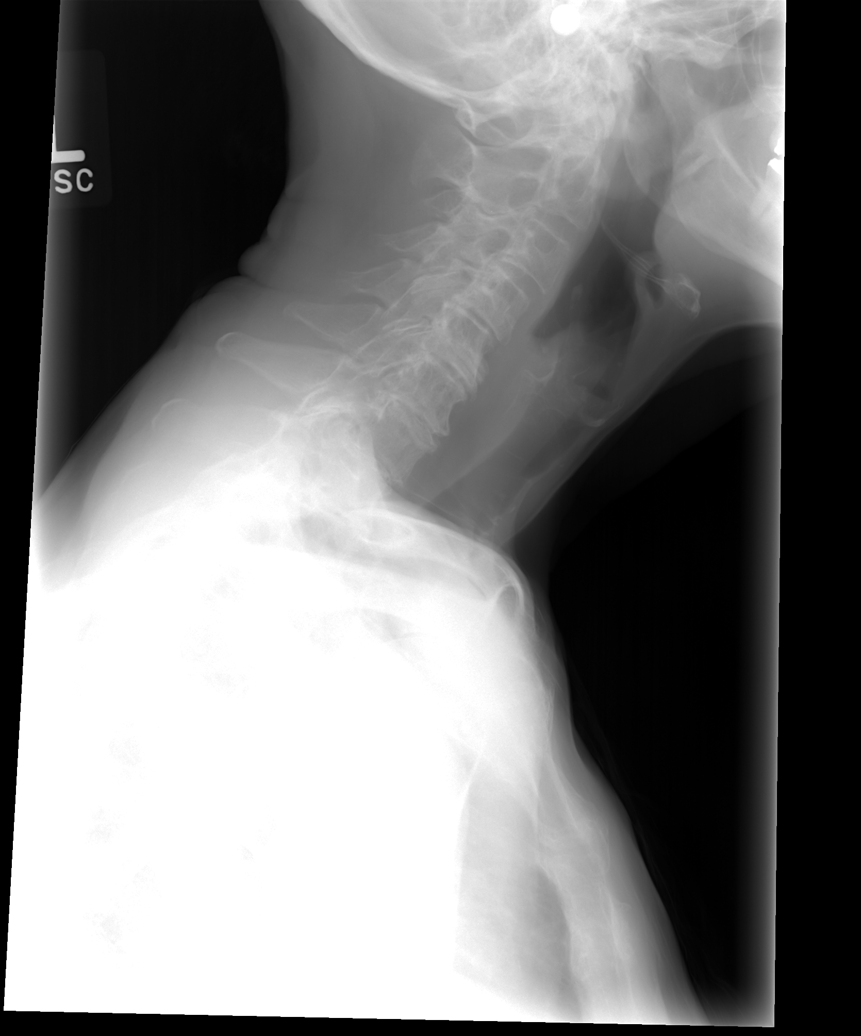

[view not recorded (5 of 5)]
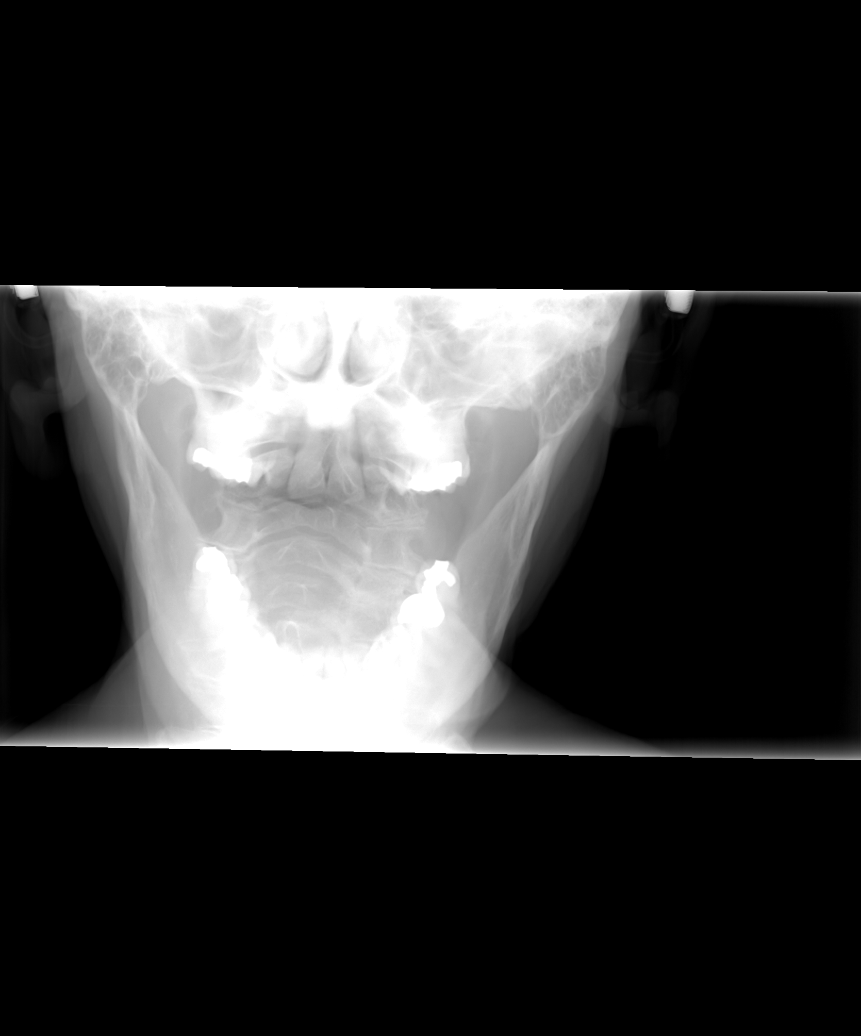

[5 of 5 positions shown; findings below may reference images not displayed]

FINDINGS: Frontal, lateral, open-mouth odontoid, and bilateral oblique views
were obtained. There is no fracture. There is minimal
anterolisthesis of C3 on C4, felt to be due to underlying
spondylosis. There is no other spondylolisthesis. Prevertebral soft
tissues and predental space regions are normal. There is marked disc
space narrowing at C4-5, C5-6, C6-7, and C7-T1. There is exit
foraminal narrowing due to facet hypertrophy at all levels
bilaterally except for C2-3. There is no erosive change.
IMPRESSION: Multilevel arthropathy. No fracture or spondylolisthesis. Minimal
spondylolisthesis at C3-4 is felt to be due to underlying
spondylosis.

## 2015-11-05 ENCOUNTER — Ambulatory Visit (INDEPENDENT_AMBULATORY_CARE_PROVIDER_SITE_OTHER): Payer: PPO | Admitting: Family Medicine

## 2015-11-05 ENCOUNTER — Encounter: Payer: Self-pay | Admitting: Family Medicine

## 2015-11-05 DIAGNOSIS — M25552 Pain in left hip: Secondary | ICD-10-CM

## 2015-11-05 MED ORDER — PREDNISONE 10 MG (21) PO TBPK: ORAL_TABLET | ORAL | 0 refills | Status: DC

## 2015-11-05 NOTE — Progress Notes (Signed)
   Subjective:  Patient ID: Courtney Newton, female    DOB: [birth year]  Age: 82 y.o. MRN: [id number]  CC: L hip pain  HPI:  82 year old female with osteoporosis presents with the above complaint.  L hip pain  Patient reports she's had left upper leg pain/hip pain for the past week.  No associated back pain.  She does report that she's had some radiation down her left leg.  Worse with sitting and worse at the end of the day.  Next sleep difficult.  Moderate in severity.  No known relieving factors.  No other associated symptoms. No other complaints at this time.  Social Hx   Social History   Social History  . Marital status: Widowed    Spouse name: N/A  . Number of children: N/A  . Years of education: N/A   Social History Main Topics  . Smoking status: Never Smoker  . Smokeless tobacco: None  . Alcohol use Yes     Comment: 4oz with dinner  . Drug use: No  . Sexual activity: No   Other Topics Concern  . None   Social History Narrative  . None    Review of Systems  Constitutional: Negative.   Musculoskeletal: Negative for back pain.       Left upper leg/hip pain.   Objective:  BP (!) 160/92 (BP Location: Right Arm, Patient Position: Sitting, Cuff Size: Small)   Pulse 75   Temp 97.7 F (36.5 C) (Oral)   Wt 99 lb 2 oz (45 kg)   SpO2 96%   BMI 17.56 kg/m   BP/Weight 11/05/2015 08/25/2015 07/11/2015  Systolic BP 160 136 138  Diastolic BP 92 82 70  Wt. (Lbs) 99.13 100.12 101.25  BMI 17.56 17.74 17.65   Physical Exam  Constitutional: No distress.  Thin elderly female in no acute distress.  Pulmonary/Chest: Effort normal.  Musculoskeletal:  Left hip - tenderness at the greater trochanter. Decreased range of motion in all planes.  Low back - nontender to palpation. Negative straight leg raise.   Neurological: She is alert.  Psychiatric: She has a normal mood and affect.  Vitals reviewed.  Lab Results  Component Value Date   WBC 9.4 07/07/2015   HGB 11.4 (L) 07/07/2015   HCT 33.5 (L) 07/07/2015   PLT 161 07/07/2015   GLUCOSE 85 07/11/2015   CHOL 151 07/08/2015   TRIG 67 07/08/2015   HDL 43 07/08/2015   LDLDIRECT 137.8 04/06/2012   LDLCALC 95 07/08/2015   ALT 36 (H) 07/11/2015   AST 27 07/11/2015   NA 143 07/11/2015   K 4.1 07/11/2015   CL 104 07/11/2015   CREATININE 0.84 07/11/2015   BUN 21 07/11/2015   CO2 32 07/11/2015   TSH 6.86 (H) 06/24/2014    Assessment & Plan:   Problem List Items Addressed This Visit    Left hip pain    New problem. Appears consistent with trochanteric bursitis. Could be coming from the lumbar spine but more likely trochanteric bursitis given exam. Treating with prednisone.       Other Visit Diagnoses   None.     Meds ordered this encounter  Medications  . predniSONE (STERAPRED UNI-PAK 21 TAB) 10 MG (21) TBPK tablet    Sig: Per package instructions.    Dispense:  21 tablet    Refill:  0   Follow-up: PRN  Everlene OtherJayce Mansel Strother DO [facility] [place]

## 2015-11-05 NOTE — Progress Notes (Signed)
Pre visit review using our clinic review tool, if applicable. No additional management support is needed unless otherwise documented below in the visit note. 

## 2015-11-05 NOTE — Assessment & Plan Note (Signed)
New problem. Appears consistent with trochanteric bursitis. Could be coming from the lumbar spine but more likely trochanteric bursitis given exam. Treating with prednisone.

## 2015-11-05 NOTE — Patient Instructions (Addendum)
Take the prednisone as prescribed.  Follow up if you fail to improve or worsen.  Take care  Dr. Adriana Simasook    Trochanteric Bursitis You have hip pain due to trochanteric bursitis. Bursitis means that the sack near the outside of the hip is filled with fluid and inflamed. This sack is made up of protective soft tissue. The pain from trochanteric bursitis can be severe and keep you from sleep. It can radiate to the buttocks or down the outside of the thigh to the knee. The pain is almost always worse when rising from the seated or lying position and with walking. Pain can improve after you take a few steps. It happens more often in people with hip joint and lumbar spine problems, such as arthritis or previous surgery. Very rarely the trochanteric bursa can become infected, and antibiotics and/or surgery may be needed. Treatment often includes an injection of local anesthetic mixed with cortisone medicine. This medicine is injected into the area where it is most tender over the hip. Repeat injections may be necessary if the response to treatment is slow. You can apply ice packs over the tender area for 30 minutes every 2 hours for the next few days. Anti-inflammatory and/or narcotic pain medicine may also be helpful. Limit your activity for the next few days if the pain continues. See your caregiver in 5-10 days if you are not greatly improved.  SEEK IMMEDIATE MEDICAL CARE IF:  You develop severe pain, fever, or increased redness.  You have pain that radiates below the knee. EXERCISES STRETCHING EXERCISES - Trochanteric Bursitis  These exercises may help you when beginning to rehabilitate your injury. Your symptoms may resolve with or without further involvement from your physician, physical therapist, or athletic trainer. While completing these exercises, remember:   Restoring tissue flexibility helps normal motion to return to the joints. This allows healthier, less painful movement and activity.  An  effective stretch should be held for at least 30 seconds.  A stretch should never be painful. You should only feel a gentle lengthening or release in the stretched tissue. STRETCH - Iliotibial Band  On the floor or bed, lie on your side so your injured leg is on top. Bend your knee and grab your ankle.  Slowly bring your knee back so that your thigh is in line with your trunk. Keep your heel at your buttocks and gently arch your back so your head, shoulders and hips line up.  Slowly lower your leg so that your knee approaches the floor/bed until you feel a gentle stretch on the outside of your thigh. If you do not feel a stretch and your knee will not fall farther, place the heel of your opposite foot on top of your knee and pull your thigh down farther.  Hold this stretch for __________ seconds.  Repeat __________ times. Complete this exercise __________ times per day. STRETCH - Hamstrings, Supine   Lie on your back. Loop a belt or towel over the ball of your foot as shown.  Straighten your knee and slowly pull on the belt to raise your injured leg. Do not allow the knee to bend. Keep your opposite leg flat on the floor.  Raise the leg until you feel a gentle stretch behind your knee or thigh. Hold this position for __________ seconds.  Repeat __________ times. Complete this stretch __________ times per day. STRETCH - Quadriceps, Prone   Lie on your stomach on a firm surface, such as a bed or  padded floor.  Bend your knee and grasp your ankle. If you are unable to reach your ankle or pant leg, use a belt around your foot to lengthen your reach.  Gently pull your heel toward your buttocks. Your knee should not slide out to the side. You should feel a stretch in the front of your thigh and/or knee.  Hold this position for __________ seconds.  Repeat __________ times. Complete this stretch __________ times per day. STRETCHING - Hip Flexors, Lunge Half kneel with your knee on the  floor and your opposite knee bent and directly over your ankle.  Keep good posture with your head over your shoulders. Tighten your buttocks to point your tailbone downward; this will prevent your back from arching too much.  You should feel a gentle stretch in the front of your thigh and/or hip. If you do not feel any resistance, slightly slide your opposite foot forward and then slowly lunge forward so your knee once again lines up over your ankle. Be sure your tailbone remains pointed downward.  Hold this stretch for __________ seconds.  Repeat __________ times. Complete this stretch __________ times per day. STRETCH - Adductors, Lunge  While standing, spread your legs.  Lean away from your injured leg by bending your opposite knee. You may rest your hands on your thigh for balance.  You should feel a stretch in your inner thigh. Hold for __________ seconds.  Repeat __________ times. Complete this exercise __________ times per day.   This information is not intended to replace advice given to you by your health care provider. Make sure you discuss any questions you have with your health care provider.   Document Released: 04/15/2004 Document Revised: 07/23/2014 Document Reviewed: 06/20/2008 Elsevier Interactive Patient Education [employer]2016 [employer] Inc[employer]

## 2015-11-28 ENCOUNTER — Ambulatory Visit (INDEPENDENT_AMBULATORY_CARE_PROVIDER_SITE_OTHER): Payer: PPO

## 2015-11-28 ENCOUNTER — Ambulatory Visit (INDEPENDENT_AMBULATORY_CARE_PROVIDER_SITE_OTHER): Payer: PPO | Admitting: Internal Medicine

## 2015-11-28 ENCOUNTER — Encounter: Payer: Self-pay | Admitting: Internal Medicine

## 2015-11-28 VITALS — BP 118/80 | HR 76 | Temp 97.6°F | Resp 14 | Ht 63.0 in | Wt 100.0 lb

## 2015-11-28 DIAGNOSIS — M25552 Pain in left hip: Secondary | ICD-10-CM

## 2015-11-28 DIAGNOSIS — R918 Other nonspecific abnormal finding of lung field: Secondary | ICD-10-CM | POA: Diagnosis not present

## 2015-11-28 DIAGNOSIS — R9389 Abnormal findings on diagnostic imaging of other specified body structures: Secondary | ICD-10-CM

## 2015-11-28 DIAGNOSIS — Z23 Encounter for immunization: Secondary | ICD-10-CM | POA: Diagnosis not present

## 2015-11-28 DIAGNOSIS — Z79899 Other long term (current) drug therapy: Secondary | ICD-10-CM | POA: Diagnosis not present

## 2015-11-28 DIAGNOSIS — R0689 Other abnormalities of breathing: Secondary | ICD-10-CM

## 2015-11-28 DIAGNOSIS — R938 Abnormal findings on diagnostic imaging of other specified body structures: Secondary | ICD-10-CM

## 2015-11-28 DIAGNOSIS — R0600 Dyspnea, unspecified: Secondary | ICD-10-CM

## 2015-11-28 LAB — BASIC METABOLIC PANEL
BUN: 26 mg/dL — ABNORMAL HIGH (ref 6–23)
CALCIUM: 9.1 mg/dL (ref 8.4–10.5)
CO2: 31 meq/L (ref 19–32)
CREATININE: 1.02 mg/dL (ref 0.40–1.20)
Chloride: 106 mEq/L (ref 96–112)
GFR: 54.42 mL/min — ABNORMAL LOW (ref >60.00)
Glucose, Bld: 63 mg/dL — ABNORMAL LOW (ref 70–99)
Potassium: 4.3 mEq/L (ref 3.5–5.1)
SODIUM: 141 meq/L (ref 135–145)

## 2015-11-28 LAB — CBC WITH DIFFERENTIAL/PLATELET
BASOS PCT: 0.5 % (ref 0.0–3.0)
Basophils Absolute: 0 10*3/uL (ref 0.0–0.1)
EOS PCT: 1.4 % (ref 0.0–5.0)
Eosinophils Absolute: 0.1 10*3/uL (ref 0.0–0.7)
HCT: 41.3 % (ref 36.0–46.0)
Hemoglobin: 14.1 g/dL (ref 12.0–15.0)
LYMPHS ABS: 2.7 10*3/uL (ref 0.7–4.0)
Lymphocytes Relative: 32.7 % (ref 12.0–46.0)
MCHC: 34.2 g/dL (ref 30.0–36.0)
MCV: 93 fl (ref 78.0–100.0)
MONO ABS: 1 10*3/uL (ref 0.1–1.0)
MONOS PCT: 12.5 % — AB (ref 3.0–12.0)
NEUTROS ABS: 4.3 10*3/uL (ref 1.4–7.7)
NEUTROS PCT: 52.9 % (ref 43.0–77.0)
Platelets: 207 10*3/uL (ref 150.0–400.0)
RBC: 4.45 Mil/uL (ref 3.87–5.11)
RDW: 13.6 % (ref 11.5–15.5)
WBC: 8.1 10*3/uL (ref 4.0–10.5)

## 2015-11-28 LAB — SEDIMENTATION RATE: Sed Rate: 1 mm/hr (ref 0–30)

## 2015-11-28 MED ORDER — MELOXICAM 7.5 MG PO TABS: 7.5000 mg | ORAL_TABLET | Freq: Every day | ORAL | 5 refills | Status: DC

## 2015-11-28 NOTE — Progress Notes (Signed)
Subjective:  Patient ID: Courtney Newton, female    DOB: March 19, 1927  Age: 82 y.o. MRN: [id number]  CC: The primary encounter diagnosis was Abnormal chest x-ray. Diagnoses of Left hip pain, Long-term use of high-risk medication, Encounter for immunization, and Dyspnea and respiratory abnormality were also pertinent to this visit.  HPI Courtney Newton presents for follow up on chroic conditions  Treated for trochanteric bursitis involving the  left side with prednisone taper .  Felt better with prednisone but pain returned as soon as the medication was stopped.  She had an extra leftover prednisone taper that she used when lhe pain returned .  She finished the second round a few weeks ago.    The pain Is lateral and radiates to the anterior thigh and occasionally below the knee.  She has no low back pain . She has no history of falls or unusual activity    APPETITE GOOD .sleeping well bowels moving regularly . Outpatient Medications Prior to Visit  Medication Sig Dispense Refill  . Calcium Carb-Cholecalciferol (CALCIUM 600 + D PO) Take 1 tablet by mouth daily.    . Multiple Vitamins-Minerals (MULTIVITAMIN PO) Take 1 tablet by mouth daily.    . predniSONE (STERAPRED UNI-PAK 21 TAB) 10 MG (21) TBPK tablet Per package instructions. 21 tablet 0  . acetaminophen (TYLENOL) 500 MG tablet Take 500 mg by mouth every 6 (six) hours as needed for mild pain or moderate pain. Reported on 08/25/2015    . albuterol (PROVENTIL HFA;VENTOLIN HFA) 108 (90 Base) MCG/ACT inhaler Inhale 2 puffs into the lungs every 6 (six) hours as needed for wheezing or shortness of breath. (Patient not taking: Reported on 11/05/2015) 1 Inhaler 2  . aspirin EC 81 MG EC tablet Take 1 tablet (81 mg total) by mouth daily. (Patient not taking: Reported on 11/05/2015) 30 tablet 2   Facility-Administered Medications Prior to Visit  Medication Dose Route Frequency Provider Last Rate Last Dose  . pneumococcal 23 valent vaccine (PNU-IMMUNE)  injection 0.5 mL  0.5 mL Intramuscular Once Sherlene Shamseresa L Orvile Corona, MD        Review of Systems;  Patient denies headache, fevers, malaise, unintentional weight loss, skin rash, eye pain, sinus congestion and sinus pain, sore throat, dysphagia,  hemoptysis , cough, dyspnea, wheezing, chest pain, palpitations, orthopnea, edema, abdominal pain, nausea, melena, diarrhea, constipation, flank pain, dysuria, hematuria, urinary  Frequency, nocturia, numbness, tingling, seizures,  Focal weakness, Loss of consciousness,  Tremor, insomnia, depression, anxiety, and suicidal ideation.      Objective:  BP 118/80 (BP Location: Left Arm, Patient Position: Sitting, Cuff Size: Small)   Pulse 76   Temp 97.6 F (36.4 C) (Oral)   Resp 14   Ht 5\' 3"  (1.6 m)   Wt 100 lb (45.4 kg)   SpO2 97%   BMI 17.71 kg/m   BP Readings from Last 3 Encounters:  11/28/15 118/80  11/05/15 (!) 160/92  08/25/15 136/82    Wt Readings from Last 3 Encounters:  11/28/15 100 lb (45.4 kg)  11/05/15 99 lb 2 oz (45 kg)  08/25/15 100 lb 1.9 oz (45.4 kg)    General appearance: alert, cooperative and appears stated age Ears: normal TM's and external ear canals both ears Throat: lips, mucosa, and tongue normal; teeth and gums normal Neck: no adenopathy, no carotid bruit, supple, symmetrical, trachea midline and thyroid not enlarged, symmetric, no tenderness/mass/nodules Back: symmetric, no curvature. ROM normal. No CVA tenderness. Lungs: clear to auscultation bilaterally Heart: regular rate  and rhythm, S1, S2 normal, no murmur, click, rub or gallop Abdomen: soft, non-tender; bowel sounds normal; no masses,  no organomegaly Pulses: 2+ and symmetric Skin: Skin color, texture, turgor normal. No rashes or lesions Lymph nodes: Cervical, supraclavicular, and axillary nodes normal.  No results found for: HGBA1C  Lab Results  Component Value Date   CREATININE 1.02 11/28/2015   CREATININE 0.84 07/11/2015   CREATININE 0.88 07/07/2015     Lab Results  Component Value Date   WBC 8.1 11/28/2015   HGB 14.1 11/28/2015   HCT 41.3 11/28/2015   PLT 207.0 11/28/2015   GLUCOSE 63 (L) 11/28/2015   CHOL 151 07/08/2015   TRIG 67 07/08/2015   HDL 43 07/08/2015   LDLDIRECT 137.8 04/06/2012   LDLCALC 95 07/08/2015   ALT 36 (H) 07/11/2015   AST 27 07/11/2015   NA 141 11/28/2015   K 4.3 11/28/2015   CL 106 11/28/2015   CREATININE 1.02 11/28/2015   BUN 26 (H) 11/28/2015   CO2 31 11/28/2015   TSH 6.86 (H) 06/24/2014    No results found.  Assessment & Plan:   Problem List Items Addressed This Visit    Dyspnea and respiratory abnormality    She was treated in April for CAP given lobar infiltrate found on chest x ray,  Changes have resolved by repeat films.      Left hip pain    Presumed to be trochanteric bursitis, responding to prednisone.  Plain films suggest degenerative joint . Trial of daily meloxicam and tylenol.        Relevant Orders   DG HIP UNILAT WITH PELVIS 2-3 VIEWS LEFT (Completed)   Sedimentation rate (Completed)   CBC with Differential/Platelet (Completed)    Other Visit Diagnoses    Abnormal chest x-ray    -  Primary   Relevant Orders   DG Chest 2 View (Completed)   Long-term use of high-risk medication       Relevant Orders   Basic metabolic panel (Completed)   Encounter for immunization       Relevant Orders   Flu vaccine HIGH DOSE PF (Completed)     A total of 25 minutes of face to face time was spent with patient more than half of which was spent in counselling about the above mentioned conditions  and coordination of care  I am having Ms. Piechota start on meloxicam. I am also having her maintain her Multiple Vitamins-Minerals (MULTIVITAMIN PO), acetaminophen, Calcium Carb-Cholecalciferol (CALCIUM 600 + D PO), aspirin, albuterol, and predniSONE. We will continue to administer pneumococcal 23 valent vaccine.  Meds ordered this encounter  Medications  . meloxicam (MOBIC) 7.5 MG tablet     Sig: Take 1 tablet (7.5 mg total) by mouth daily.    Dispense:  30 tablet    Refill:  5    There are no discontinued medications.  Follow-up: Return in about 3 months (around 02/27/2016).   Sherlene Shams, MD

## 2015-11-28 NOTE — Patient Instructions (Addendum)
    I am prescribing you Meloxicam once daily  To take the place of the prednisone  You can add tylenol (acetominophen) 650 mg every 8 hours ,  Or 500 mg every 6 hours,  Whichever dose you have,  But the maximal dose for daily use is  2000 mg daily

## 2015-11-28 NOTE — Progress Notes (Signed)
Pre visit review using our clinic review tool, if applicable. No additional management support is needed unless otherwise documented below in the visit note. 

## 2015-11-30 NOTE — Assessment & Plan Note (Signed)
She was treated in April for CAP given lobar infiltrate found on chest x ray,  Changes have resolved by repeat films.

## 2015-11-30 NOTE — Assessment & Plan Note (Addendum)
Presumed to be trochanteric bursitis, responding to prednisone.  Plain films suggest degenerative joint . Trial of daily meloxicam and tylenol.

## 2015-12-01 NOTE — Progress Notes (Signed)
Patient notified

## 2016-02-27 ENCOUNTER — Encounter: Payer: Self-pay | Admitting: Internal Medicine

## 2016-02-27 ENCOUNTER — Ambulatory Visit (INDEPENDENT_AMBULATORY_CARE_PROVIDER_SITE_OTHER): Payer: PPO | Admitting: Internal Medicine

## 2016-02-27 DIAGNOSIS — R636 Underweight: Secondary | ICD-10-CM

## 2016-02-27 DIAGNOSIS — R413 Other amnesia: Secondary | ICD-10-CM | POA: Diagnosis not present

## 2016-02-27 DIAGNOSIS — M810 Age-related osteoporosis without current pathological fracture: Secondary | ICD-10-CM

## 2016-02-27 NOTE — Progress Notes (Signed)
Pre-visit discussion using our clinic review tool. No additional management support is needed unless otherwise documented below in the visit note.  

## 2016-02-27 NOTE — Patient Instructions (Addendum)
You are doing well !  I 'm GLAD YOU HAVE GAINED A POUND    Have a great trip!  Remember to TAKE THE MASK WITH YOU ON THE FLIGHT ,  And to  flush your sinuses  when you get to your  Sister's house  With a saline rinse  Lloyd Huger([facility]  ).  This is a great way to flush out the viruses and bacteria you pick up when you are in the airport and on the plane and will prevent infection    We will get the injection called Prolia authorized by your insurance to treat your osteoporosis,  To prevent fractures

## 2016-02-27 NOTE — Progress Notes (Signed)
Subjective:  Patient ID: Courtney Newton Ziesmer, female    DOB: Feb 03, 1927  Age: 82 y.o. MRN: [id number]  CC: Diagnoses of Underweight, Age-related osteoporosis without current pathological fracture, and Memory loss, short term were pertinent to this visit.  HPI Courtney Newton Nuccio presents for follow up on several issues. She feels generally well except for chronic sinus drainage.   At her last visit 3 months ago she was asked to have a repeat chest x ray due to history of abnormal chest x ray.  The previously noted :  RUL infiltrate had esolved by september chest x ray  Underweight: her appetite has improved and she has  gained a lb.  Osteoporosis:  Had another discussion with her about treatment options .  discussed Prolia for osteoporosis       Outpatient Medications Prior to Visit  Medication Sig Dispense Refill  . acetaminophen (TYLENOL) 500 MG tablet Take 500 mg by mouth every 6 (six) hours as needed for mild pain or moderate pain. Reported on 08/25/2015    . Calcium Carb-Cholecalciferol (CALCIUM 600 + D PO) Take 1 tablet by mouth daily.    . Multiple Vitamins-Minerals (MULTIVITAMIN PO) Take 1 tablet by mouth daily.    Marland Kitchen. albuterol (PROVENTIL HFA;VENTOLIN HFA) 108 (90 Base) MCG/ACT inhaler Inhale 2 puffs into the lungs every 6 (six) hours as needed for wheezing or shortness of breath. (Patient not taking: Reported on 02/27/2016) 1 Inhaler 2  . aspirin EC 81 MG EC tablet Take 1 tablet (81 mg total) by mouth daily. (Patient not taking: Reported on 02/27/2016) 30 tablet 2  . meloxicam (MOBIC) 7.5 MG tablet Take 1 tablet (7.5 mg total) by mouth daily. (Patient not taking: Reported on 02/27/2016) 30 tablet 5  . predniSONE (STERAPRED UNI-PAK 21 TAB) 10 MG (21) TBPK tablet Per package instructions. (Patient not taking: Reported on 02/27/2016) 21 tablet 0   Facility-Administered Medications Prior to Visit  Medication Dose Route Frequency Provider Last Rate Last Dose  . pneumococcal 23 valent vaccine  (PNU-IMMUNE) injection 0.5 mL  0.5 mL Intramuscular Once Sherlene Shamseresa L Bernerd Terhune, MD        Review of Systems;  Patient denies headache, fevers, malaise, unintentional weight loss, skin rash, eye pain, sinus congestion and sinus pain, sore throat, dysphagia,  hemoptysis , cough, dyspnea, wheezing, chest pain, palpitations, orthopnea, edema, abdominal pain, nausea, melena, diarrhea, constipation, flank pain, dysuria, hematuria, urinary  Frequency, nocturia, numbness, tingling, seizures,  Focal weakness, Loss of consciousness,  Tremor, insomnia, depression, anxiety, and suicidal ideation.      Objective:  BP 120/72   Pulse 70   Temp 97.5 F (36.4 C) (Oral)   Resp 12   Ht 5\' 3"  (1.6 m)   Wt 101 lb 4 oz (45.9 kg)   SpO2 99%   BMI 17.94 kg/m   BP Readings from Last 3 Encounters:  02/27/16 120/72  11/28/15 118/80  11/05/15 (!) 160/92    Wt Readings from Last 3 Encounters:  02/27/16 101 lb 4 oz (45.9 kg)  11/28/15 100 lb (45.4 kg)  11/05/15 99 lb 2 oz (45 kg)    General appearance: alert, cooperative and appears stated age Ears: normal TM's and external ear canals both ears Throat: lips, mucosa, and tongue normal; teeth and gums normal Neck: no adenopathy, no carotid bruit, supple, symmetrical, trachea midline and thyroid not enlarged, symmetric, no tenderness/mass/nodules Back: symmetric, no curvature. ROM normal. No CVA tenderness. Lungs: clear to auscultation bilaterally Heart: regular rate and rhythm, S1, S2  normal, no murmur, click, rub or gallop Abdomen: soft, non-tender; bowel sounds normal; no masses,  no organomegaly Pulses: 2+ and symmetric Skin: Skin color, texture, turgor normal. No rashes or lesions Lymph nodes: Cervical, supraclavicular, and axillary nodes normal.  No results found for: HGBA1C  Lab Results  Component Value Date   CREATININE 1.02 11/28/2015   CREATININE 0.84 07/11/2015   CREATININE 0.88 07/07/2015    Lab Results  Component Value Date   WBC 8.1  11/28/2015   HGB 14.1 11/28/2015   HCT 41.3 11/28/2015   PLT 207.0 11/28/2015   GLUCOSE 63 (L) 11/28/2015   CHOL 151 07/08/2015   TRIG 67 07/08/2015   HDL 43 07/08/2015   LDLDIRECT 137.8 04/06/2012   LDLCALC 95 07/08/2015   ALT 36 (H) 07/11/2015   AST 27 07/11/2015   NA 141 11/28/2015   K 4.3 11/28/2015   CL 106 11/28/2015   CREATININE 1.02 11/28/2015   BUN 26 (H) 11/28/2015   CO2 31 11/28/2015   TSH 6.86 (H) 06/24/2014    No results found.  Assessment & Plan:   Problem List Items Addressed This Visit    Memory loss, short term    She has  short term memory loss but continues to live independently without incident.       Osteoporosis    She has no history of fractures.  Discussed the current controversies surrounding the risks and benefits of calcium supplementation.  Encouraged her to increase dietary calcium through natural foods including almond/coconut milk.discussed the risks and benefits of Prolia therapy and she has agreed to reconsider if it can be obtained for < $50 per injection       Underweight     I have reviewed her diet and recommended that she increase her protein and fat intake while monitoring her carbohydrates.          I am having Ms. Arne ClevelandHealy maintain her Multiple Vitamins-Minerals (MULTIVITAMIN PO), acetaminophen, Calcium Carb-Cholecalciferol (CALCIUM 600 + D PO), aspirin, albuterol, predniSONE, and meloxicam. We will continue to administer pneumococcal 23 valent vaccine.  No orders of the defined types were placed in this encounter.   There are no discontinued medications.  Follow-up: No Follow-up on file.   Sherlene ShamsULLO, Zair Borawski L, MD

## 2016-02-28 NOTE — Assessment & Plan Note (Signed)
I have reviewed her diet and recommended that she increase her protein and fat intake while monitoring her carbohydrates.  

## 2016-02-28 NOTE — Assessment & Plan Note (Addendum)
She has no history of fractures.  Discussed the current controversies surrounding the risks and benefits of calcium supplementation.  Encouraged her to increase dietary calcium through natural foods including almond/coconut milk.discussed the risks and benefits of Prolia therapy and she has agreed to reconsider if it can be obtained for < $50 per injection

## 2016-02-28 NOTE — Assessment & Plan Note (Signed)
She has  short term memory loss but continues to live independently without incident.

## 2016-04-01 ENCOUNTER — Telehealth: Payer: Self-pay | Admitting: Internal Medicine

## 2016-04-01 NOTE — Telephone Encounter (Signed)
Prolia authorized but cost of Prolia for patient will be 20% of Administration fee and 20% of cost of prolia and 1$ 10  Co-pay. In note per chart patient stated it would have to be less than $50 per month. NOTe attached per 12/17   She has no history of fractures.  Discussed the current controversies surrounding the risks and benefits of calcium supplementation.  Encouraged her to increase dietary calcium through natural foods including almond/coconut milk.discussed the risks and benefits of Prolia therapy and she has agreed to reconsider if it can be obtained for < $50 per injection

## 2016-04-01 NOTE — Telephone Encounter (Signed)
Let patient know that the cost is too $$$ out of pocket for Prolia.

## 2016-04-21 NOTE — Telephone Encounter (Signed)
Patient will not pay more than 50.00 for prolia and does not qualify for pre-paid card.

## 2016-05-27 ENCOUNTER — Encounter: Payer: Self-pay | Admitting: Internal Medicine

## 2016-05-27 ENCOUNTER — Ambulatory Visit (INDEPENDENT_AMBULATORY_CARE_PROVIDER_SITE_OTHER): Payer: PPO | Admitting: Internal Medicine

## 2016-05-27 DIAGNOSIS — R636 Underweight: Secondary | ICD-10-CM

## 2016-05-27 DIAGNOSIS — R413 Other amnesia: Secondary | ICD-10-CM

## 2016-05-27 DIAGNOSIS — M810 Age-related osteoporosis without current pathological fracture: Secondary | ICD-10-CM

## 2016-05-27 NOTE — Progress Notes (Signed)
Pre visit review using our clinic review tool, if applicable. No additional management support is needed unless otherwise documented below in the visit note. 

## 2016-05-27 NOTE — Progress Notes (Signed)
Subjective:  Patient ID: Courtney Newton, female    DOB: 01/29/1927  Age: 83 y.o. MRN: [id number]  CC: Diagnoses of Underweight, Memory loss, short term, and Age-related osteoporosis without current pathological fracture were pertinent to this visit.  HPI Courtney Newton presents for follow up on cognitive changes and weight loss.  She recently flew to [place] to visit her  81 yr old independently [place]ing [place] around the the [place],  D[place] herself to [place]     No traffic accidents.  Doesn't like  Driving in the rain  Eating 3 square meals daily.  Snacks  Occasionally  Not taking any meds except the  MVI and calcium.   Sleeping fine .  No nocturia .  No falls recently walks daily for exercise .  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 albuterol (PROVENTIL HFA;VENTOLIN HFA) 108 (90 Base) MCG/ACT inhaler Inhale 2 puffs into the lungs every 6 (six) hours as needed for wheezing or shortness of breath. 1 Inhaler 2  . Calcium Carb-Cholecalciferol (CALCIUM 600 + D PO) Take 1 tablet by mouth daily.    . Multiple Vitamins-Minerals (MULTIVITAMIN PO) Take 1 tablet by mouth daily.    Marland Kitchen. acetaminophen (TYLENOL) 500 MG tablet Take 500 mg by mouth every 6 (six) hours as needed for mild pain or moderate pain. Reported on 08/25/2015    . aspirin EC 81 MG EC tablet Take 1 tablet (81 mg total) by mouth daily. (Patient not taking: Reported on 02/27/2016) 30 tablet 2  . meloxicam (MOBIC) 7.5 MG tablet Take 1 tablet (7.5 mg total) by mouth daily. (Patient not taking: Reported on 02/27/2016) 30 tablet 5  . predniSONE (STERAPRED UNI-PAK 21 TAB) 10 MG (21) TBPK tablet Per package instructions. (Patient not taking: Reported on 02/27/2016) 21 tablet 0   Facility-Administered Medications Prior to Visit  Medication Dose Route Frequency Provider Last Rate Last Dose  . pneumococcal 23 valent vaccine (PNU-IMMUNE)  injection 0.5 mL  0.5 mL Intramuscular Once Sherlene Shamseresa L Tullo, MD        Review of Systems;  Patient denies headache, fevers, malaise, unintentional weight loss, skin rash, eye pain, sinus congestion and sinus pain, sore throat, dysphagia,  hemoptysis , cough, dyspnea, wheezing, chest pain, palpitations, orthopnea, edema, abdominal pain, nausea, melena, diarrhea, constipation, flank pain, dysuria, hematuria, urinary  Frequency, nocturia, numbness, tingling, seizures,  Focal weakness, Loss of consciousness,  Tremor, insomnia, depression, anxiety, and suicidal ideation.      Objective:  BP 124/78 (BP Location: Left Arm, Patient Position: Sitting, Cuff Size: Normal)   Pulse 72   Resp 15   Wt 100 lb 9.6 oz (45.6 kg)   SpO2 96%   BMI 17.82 kg/m   BP Readings from Last 3 Encounters:  05/27/16 124/78  02/27/16 120/72  11/28/15 118/80    Wt Readings from Last 3 Encounters:  05/27/16 100 lb 9.6 oz (45.6 kg)  02/27/16 101 lb 4 oz (45.9 kg)  11/28/15 100 lb (45.4 kg)    General appearance: alert, cooperative and appears stated age Ears: normal TM's and external ear canals both ears Throat: lips, mucosa, and tongue normal; teeth and gums normal Neck: no adenopathy, no carotid bruit, supple, symmetrical, trachea midline and thyroid not enlarged, symmetric, no tenderness/mass/nodules Back: symmetric, no curvature. ROM normal. No CVA tenderness. Lungs: clear to auscultation bilaterally Heart: regular rate and  rhythm, S1, S2 normal, no murmur, click, rub or gallop Abdomen: soft, non-tender; bowel sounds normal; no masses,  no organomegaly Pulses: 2+ and symmetric Skin: Skin color, texture, turgor normal. No rashes or lesions Lymph nodes: Cervical, supraclavicular, and axillary nodes normal. Neuro:  awake and interactive with normal mood and affect. Higher cortical functions are normal. Speech is clear without word-finding difficulty or dysarthria. Extraocular movements are intact. Visual fields  of both eyes are grossly intact. Sensation to light touch is grossly intact bilaterally of upper and lower extremities. Motor examination shows 4+/5 symmetric hand grip and upper extremity and 5/5 lower extremity strength. There is no pronation or drift. Gait is non-ataxic    No results found for: HGBA1C  Lab Results  Component Value Date   CREATININE 1.02 11/28/2015   CREATININE 0.84 07/11/2015   CREATININE 0.88 07/07/2015    Lab Results  Component Value Date   WBC 8.1 11/28/2015   HGB 14.1 11/28/2015   HCT 41.3 11/28/2015   PLT 207.0 11/28/2015   GLUCOSE 63 (L) 11/28/2015   CHOL 151 07/08/2015   TRIG 67 07/08/2015   HDL 43 07/08/2015   LDLDIRECT 137.8 04/06/2012   LDLCALC 95 07/08/2015   ALT 36 (H) 07/11/2015   AST 27 07/11/2015   NA 141 11/28/2015   K 4.3 11/28/2015   CL 106 11/28/2015   CREATININE 1.02 11/28/2015   BUN 26 (H) 11/28/2015   CO2 31 11/28/2015   TSH 6.86 (H) 06/24/2014    No results found.  Assessment & Plan:   Problem List Items Addressed This Visit    Memory loss, short term    She has  short term memory loss but continues to live independently without incident.       Osteoporosis    She has no history of fractures.  Discussed the current controversies surrounding the risks and benefits of calcium supplementation.  Encouraged her to increase dietary calcium through natural foods including almond/coconut milk.discussed the risks and benefits of Prolia therapy and she has agreed to reconsider if it can be obtained for < $50 per injection       Underweight     I have reviewed her diet and recommended that she increase her protein and fat intake while monitoring her carbohydrates.         A total of 25 minutes of face to face time was spent with patient more than half of which was spent in counselling about the above mentioned conditions  and coordination of care  I have discontinued [name]'s acetaminophen, aspirin, predniSONE, and meloxicam.  I am also having her maintain her Multiple Vitamins-Minerals (MULTIVITAMIN PO), Calcium Carb-Cholecalciferol (CALCIUM 600 + D PO), and albuterol. We will continue to administer pneumococcal 23 valent vaccine.  No orders of the defined types were placed in this encounter.   Medications Discontinued During This Encounter  Medication Reason  . acetaminophen (TYLENOL) 500 MG tablet Patient has not taken in last 30 days  . meloxicam (MOBIC) 7.5 MG tablet Patient has not taken in last 30 days  . aspirin EC 81 MG EC tablet Patient has not taken in last 30 days  . predniSONE (STERAPRED UNI-PAK 21 TAB) 10 MG (21) TBPK tablet Patient has not taken in last 30 days    Follow-up: Return in about 6 months (around 11/27/2016).   Sherlene Shams, MD

## 2016-05-27 NOTE — Patient Instructions (Signed)
You are doing well!  Keep walking daily,  Make sure you get cheese and peanut butter snacks daily and I'll see you in 3 months    Mediterranean Diet A Mediterranean diet refers to food and lifestyle choices that are based on the traditions of countries located on the [place]. This way of eating has been shown to help prevent certain conditions and improve outcomes for people who have chronic diseases, like kidney disease and heart disease. What are tips for following this plan? Lifestyle   Cook and eat meals together with your family, when possible.  Drink enough fluid to keep your urine clear or pale yellow.  Be physically active every day. This includes:  Aerobic exercise like running or swimming.  Leisure activities like gardening, walking, or housework.  Get 7-8 hours of sleep each night.  If recommended by your health care provider, drink red wine in moderation. This means 1 glass a day for nonpregnant women and 2 glasses a day for men. A glass of wine equals 5 oz (150 mL). Reading food labels   Check the serving size of packaged foods. For foods such as rice and pasta, the serving size refers to the amount of cooked product, not dry.  Check the total fat in packaged foods. Avoid foods that have saturated fat or trans fats.  Check the ingredients list for added sugars, such as corn syrup. Shopping   At the grocery store, buy most of your food from the areas near the walls of the store. This includes:  Fresh fruits and vegetables (produce).  Grains, beans, nuts, and seeds. Some of these may be available in unpackaged forms or large amounts (in bulk).  Fresh seafood.  Poultry and eggs.  Low-fat dairy products.  Buy whole ingredients instead of prepackaged foods.  Buy fresh fruits and vegetables in-season from local farmers markets.  Buy frozen fruits and vegetables in resealable bags.  If you do not have access to quality fresh seafood, buy precooked  frozen shrimp or canned fish, such as tuna, salmon, or sardines.  Buy small amounts of raw or cooked vegetables, salads, or olives from the deli or salad bar at your store.  Stock your pantry so you always have certain foods on hand, such as olive oil, canned tuna, canned tomatoes, rice, pasta, and beans. Cooking   Cook foods with extra-virgin olive oil instead of using butter or other vegetable oils.  Have meat as a side dish, and have vegetables or grains as your main dish. This means having meat in small portions or adding small amounts of meat to foods like pasta or stew.  Use beans or vegetables instead of meat in common dishes like chili or lasagna.  Experiment with different cooking methods. Try roasting or broiling vegetables instead of steaming or sauteing them.  Add frozen vegetables to soups, stews, pasta, or rice.  Add nuts or seeds for added healthy fat at each meal. You can add these to yogurt, salads, or vegetable dishes.  Marinate fish or vegetables using olive oil, lemon juice, garlic, and fresh herbs. Meal planning   Plan to eat 1 vegetarian meal one day each week. Try to work up to 2 vegetarian meals, if possible.  Eat seafood 2 or more times a week.  Have healthy snacks readily available, such as:  Vegetable sticks with hummus.  Greek yogurt.  Fruit and nut trail mix.  Eat balanced meals throughout the week. This includes:  Fruit: 2-3 servings a day  Vegetables: 4-5 servings a day  Low-fat dairy: 2 servings a day  Fish, poultry, or lean meat: 1 serving a day  Beans and legumes: 2 or more servings a week  Nuts and seeds: 1-2 servings a day  Whole grains: 6-8 servings a day  Extra-virgin olive oil: 3-4 servings a day  Limit red meat and sweets to only a few servings a month What are my food choices?  Mediterranean diet  Recommended  Grains: Whole-grain pasta. Brown rice. Bulgar wheat. Polenta. Couscous. Whole-wheat bread. [place].  Vegetables: Artichokes. Beets. Broccoli. Cabbage. Carrots. Eggplant. Green beans. Chard. Kale. Spinach. Onions. Leeks. Peas. Squash. Tomatoes. Peppers. Radishes.  Fruits: Apples. Apricots. Avocado. Berries. Bananas. Cherries. Dates. Figs. Grapes. Lemons. Melon. Oranges. Peaches. Plums. Pomegranate.  Meats and other protein foods: Beans. Almonds. Sunflower seeds. Pine nuts. Peanuts. [place]. Salmon. Scallops. Shrimp. [place]. Tilapia. Clams. Oysters. Eggs.  Dairy: Low-fat milk. Cheese. Greek yogurt.  Beverages: Water. Red wine. Herbal tea.  Fats and oils: Extra virgin olive oil. Avocado oil. Grape seed oil.  Sweets and desserts: [place] yogurt with honey. Baked apples. Poached pears. Trail mix.  Seasoning and other foods: Basil. Cilantro. Coriander. Cumin. Mint. Parsley. Sage. Rosemary. Tarragon. Garlic. Oregano. Thyme. Pepper. Balsalmic vinegar. Tahini. Hummus. Tomato sauce. Olives. Mushrooms.  Limit these  Grains: Prepackaged pasta or rice dishes. Prepackaged cereal with added sugar.  Vegetables: Deep fried potatoes (french fries).  Fruits: Fruit canned in syrup.  Meats and other protein foods: Beef. Pork. Lamb. Poultry with skin. Hot dogs. Berniece Salines.  Dairy: Ice cream. Sour cream. Whole milk.  Beverages: Juice. Sugar-sweetened soft drinks. Beer. Liquor and spirits.  Fats and oils: Butter. Canola oil. Vegetable oil. Beef fat (tallow). Lard.  Sweets and desserts: Cookies. Cakes. Pies. Candy.  Seasoning and other foods: Mayonnaise. Premade sauces and marinades.  The items listed may not be a complete list. Talk with your dietitian about what dietary choices are right for you. Summary  The Mediterranean diet includes both food and lifestyle choices.  Eat a variety of fresh fruits and vegetables, beans, nuts, seeds, and whole grains.  Limit the amount of red meat and sweets that you eat.  Talk with your health care provider about whether it is safe for you to drink red wine in  moderation. This means 1 glass a day for nonpregnant women and 2 glasses a day for men. A glass of wine equals 5 oz (150 mL). This information is not intended to replace advice given to you by your health care provider. Make sure you discuss any questions you have with your health care provider. Document Released: 10/30/2015 Document Revised: 12/02/2015 Document Reviewed: 10/30/2015 Elsevier Interactive Patient Education  2017 [employer].

## 2016-05-29 NOTE — Assessment & Plan Note (Signed)
She has no history of fractures.  Discussed the current controversies surrounding the risks and benefits of calcium supplementation.  Encouraged her to increase dietary calcium through natural foods including almond/coconut milk.discussed the risks and benefits of Prolia therapy and she has agreed to reconsider if it can be obtained for < $50 per injection  

## 2016-05-29 NOTE — Assessment & Plan Note (Signed)
She has  short term memory loss but continues to live independently without incident.  

## 2016-05-29 NOTE — Assessment & Plan Note (Signed)
I have reviewed her diet and recommended that she increase her protein and fat intake while monitoring her carbohydrates.  

## 2016-08-24 ENCOUNTER — Ambulatory Visit (INDEPENDENT_AMBULATORY_CARE_PROVIDER_SITE_OTHER): Payer: PPO

## 2016-08-24 VITALS — BP 128/64 | HR 57 | Temp 98.4°F | Resp 14 | Ht 63.0 in | Wt 96.8 lb

## 2016-08-24 DIAGNOSIS — Z0000 Encounter for general adult medical examination without abnormal findings: Secondary | ICD-10-CM | POA: Diagnosis not present

## 2016-08-24 DIAGNOSIS — Z1389 Encounter for screening for other disorder: Secondary | ICD-10-CM

## 2016-08-24 DIAGNOSIS — Z1331 Encounter for screening for depression: Secondary | ICD-10-CM

## 2016-08-24 NOTE — Progress Notes (Signed)
Subjective:   Courtney Newton is a 83 y.o. female who presents for Medicare Annual (Subsequent) preventive examination.  Review of Systems:  No ROS.  Medicare Wellness Visit. Cardiac Risk Factors include: advanced age (>77men, >34 women)     Objective:     Vitals: BP 128/64 (BP Location: Left Arm, Patient Position: Sitting, Cuff Size: Normal)   Pulse (!) 57   Temp 98.4 F (36.9 C) (Oral)   Resp 14   Ht 5\' 3"  (1.6 m)   Wt 96 lb 12.8 oz (43.9 kg)   SpO2 97%   BMI 17.15 kg/m   Body mass index is 17.15 kg/m.   Tobacco History  Smoking Status  . Never Smoker  Smokeless Tobacco  . Never Used     Counseling given: Not Answered   Past Medical History:  Diagnosis Date  . History of lumpectomy of both breasts   . Osteoporosis   . Vitamin D deficiency    Past Surgical History:  Procedure Laterality Date  . ABDOMINAL HYSTERECTOMY  1950  . APPENDECTOMY    . BILATERAL OOPHORECTOMY  1950   Family History  Problem Relation Age of Onset  . Heart disease Mother   . Heart disease Father    History  Sexual Activity  . Sexual activity: No    Outpatient Encounter Prescriptions as of 08/24/2016  Medication Sig  . Calcium Carb-Cholecalciferol (CALCIUM 600 + D PO) Take 1 tablet by mouth daily.  . Multiple Vitamins-Minerals (MULTIVITAMIN PO) Take 1 tablet by mouth daily.  . [DISCONTINUED] albuterol (PROVENTIL HFA;VENTOLIN HFA) 108 (90 Base) MCG/ACT inhaler Inhale 2 puffs into the lungs every 6 (six) hours as needed for wheezing or shortness of breath.   Facility-Administered Encounter Medications as of 08/24/2016  Medication  . pneumococcal 23 valent vaccine (PNU-IMMUNE) injection 0.5 mL    Activities of Daily Living In your present state of health, do you have any difficulty performing the following activities: 08/24/2016 08/25/2015  Hearing? Courtney Newton  Vision? N Y  Difficulty concentrating or making decisions? N Y  Walking or climbing stairs? N Y  Dressing or bathing? N N    Doing errands, shopping? N N  Preparing Food and eating ? N N  Using the Toilet? N N  In the past six months, have you accidently leaked urine? N N  Do you have problems with loss of bowel control? N N  Managing your Medications? N N  Managing your Finances? N N  Housekeeping or managing your Housekeeping? N N  Some recent data might be hidden    Patient Care Team: Sherlene Shams, MD as PCP - General (Internal Medicine)    Assessment:    This is a routine wellness examination for Courtney Newton. The goal of the wellness visit is to assist the patient how to close the gaps in care and create a preventative care plan for the patient.   Taking calcium VIT D as appropriate/Osteoporosis reviewed.  Medications reviewed; taking without issues or barriers.  Safety issues reviewed; smoke detectors in the home. No firearms in the home.  Wears seatbelts when driving or riding with others. Patient does wear sunscreen or protective clothing when in direct sunlight. No violence in the home.  Depression- PHQ 2 &9 complete.  No signs/symptoms or verbal communication regarding little pleasure in doing things, feeling down, depressed or hopeless. No changes in sleeping, energy, eating, concentrating.  No thoughts of self harm or harm towards others.  Time spent on this topic  is 8 minutes.   Patient is alert, normal appearance, oriented to person/place/and time. Correctly identified the president of the [place], recall of 2/3 words, and performing simple calculations.  Patient displays appropriate judgement and can read correct time from watch face.  No new identified risk were noted.  No failures at ADL's or IADL's.   BMI- discussed the importance of a healthy diet, water intake and exercise. Educational material provided.   24 diet recall: Breakfast: Cereal, fruit Lunch: Deli sandwich Dinner: Pasta with meat sauce  Daily fluid intake: 8 cups of caffeine,  cups of water Encouraged to drink less  caffeine and more water  Dental- every six months.  Eye- Visual acuity not assessed per patient preference since they have regular follow up with the ophthalmologist.  Wears corrective lenses.  Sleep patterns- Sleeps 8 hours at night.  Wakes feeling rested.  Health maintenance gaps- closed.  Patient Concerns: None at this time. Follow up with PCP as needed.  Exercise Activities and Dietary recommendations Current Exercise Habits: Home exercise routine, Type of exercise: walking, Frequency (Times/Week): 5, Intensity: Moderate  Goals    . Healthy Lifestyle          Healthy foods. Continue drinking BOOST/ENSURE daily as directed by Dr. Darrick Huntsmanullo. Stay active and continue walking for exercise.      . Increase water intake          Stay hydrated and drink more water Finish entire cup of water when taking medication Sit bottle of water in view to help remind you to drink       Fall Risk Fall Risk  08/24/2016 08/25/2015 06/24/2014 05/07/2014 07/17/2012  Falls in the past year? No No No No No   Depression Screen PHQ 2/9 Scores 08/24/2016 08/25/2015 06/24/2014 05/07/2014  PHQ - 2 Score 0 0 0 0  PHQ- 9 Score 0 - - -     Cognitive Function MMSE - Mini Mental State Exam 08/24/2016 08/25/2015  Orientation to time 5 5  Orientation to Place 5 5  Registration 3 3  Attention/ Calculation 5 5  Recall 3 3  Language- name 2 objects 2 2  Language- repeat 1 1  Language- follow 3 step command 3 3  Language- read & follow direction 1 1  Write a sentence 1 1  Copy design 1 1  Total score 30 30        Immunization History  Administered Date(s) Administered  . Influenza Split 01/29/2012  . Influenza, High Dose Seasonal PF 11/28/2015  . Influenza,inj,Quad PF,36+ Mos 01/01/2014  . Influenza-Unspecified 12/20/2012  . Pneumococcal Conjugate-13 07/04/2014  . Pneumococcal Polysaccharide-23 03/30/2012  . Tdap 07/19/2012  . Zoster 07/19/2012   Screening Tests Health Maintenance  Topic Date Due  .  INFLUENZA VACCINE  10/20/2016  . TETANUS/TDAP  07/20/2022  . DEXA SCAN  Completed  . PNA vac Low Risk Adult  Completed      Plan:   End of life planning; Advanced aging; Advanced directives discussed.  No HCPOA/Living Will.  Additional information provided to help them start the conversation with family.  Copy of HCPOA/Living Will requested upon completion. Time spent on this topic is 25 minutes.  I have personally reviewed and noted the following in the patient's chart:   . Medical and social history . Use of alcohol, tobacco or illicit drugs  . Current medications and supplements . Functional ability and status . Nutritional status . Physical activity . Advanced directives . List of other physicians . Hospitalizations,  surgeries, and ER visits in previous 12 months . Vitals . Screenings to include cognitive, depression, and falls . Referrals and appointments  In addition, I have reviewed and discussed with patient certain preventive protocols, quality metrics, and best practice recommendations. A written personalized care plan for preventive services as well as general preventive health recommendations were provided to patient.     Ashok Pall, LPN  11/25/2839

## 2016-08-24 NOTE — Patient Instructions (Addendum)
  [name] , Thank you for taking time to come for your Medicare Wellness Visit. I appreciate your ongoing commitment to your health goals. Please review the following plan we discussed and let me know if I can assist you in the future.   Follow up with Dr. Darrick Huntsmanullo as needed.    Bring a copy of your Health Care Power of Attorney and/or Living Will to be scanned into chart.  Have a great day!  These are the goals we discussed: Goals    . Healthy Lifestyle          Healthy foods. Continue drinking BOOST/ENSURE daily as directed by Dr. Darrick Huntsmanullo. Stay active and continue walking for exercise.      . Increase water intake          Stay hydrated and drink more water Finish entire cup of water when taking medication Sit bottle of water in view to help remind you to drink        This is a list of the screening recommended for you and due dates:  Health Maintenance  Topic Date Due  . Flu Shot  10/20/2016  . Tetanus Vaccine  07/20/2022  . DEXA scan (bone density measurement)  Completed  . Pneumonia vaccines  Completed

## 2016-08-24 NOTE — Progress Notes (Signed)
Care was provided under my supervision. I agree with the management as indicated in the note.  Lajeana Strough DO  

## 2016-12-02 ENCOUNTER — Encounter: Payer: Self-pay | Admitting: Internal Medicine

## 2016-12-02 ENCOUNTER — Ambulatory Visit (INDEPENDENT_AMBULATORY_CARE_PROVIDER_SITE_OTHER): Payer: PPO | Admitting: Internal Medicine

## 2016-12-02 VITALS — BP 138/76 | HR 68 | Temp 97.6°F | Resp 14 | Ht 63.0 in | Wt 96.4 lb

## 2016-12-02 DIAGNOSIS — E559 Vitamin D deficiency, unspecified: Secondary | ICD-10-CM

## 2016-12-02 DIAGNOSIS — R636 Underweight: Secondary | ICD-10-CM | POA: Diagnosis not present

## 2016-12-02 DIAGNOSIS — R634 Abnormal weight loss: Secondary | ICD-10-CM

## 2016-12-02 DIAGNOSIS — R413 Other amnesia: Secondary | ICD-10-CM

## 2016-12-02 LAB — VITAMIN D 25 HYDROXY (VIT D DEFICIENCY, FRACTURES): VITD: 36.01 ng/mL (ref 30.00–100.00)

## 2016-12-02 LAB — COMPREHENSIVE METABOLIC PANEL
ALK PHOS: 70 U/L (ref 39–117)
ALT: 12 U/L (ref 0–35)
AST: 24 U/L (ref 0–37)
Albumin: 4.4 g/dL (ref 3.5–5.2)
BILIRUBIN TOTAL: 0.5 mg/dL (ref 0.2–1.2)
BUN: 27 mg/dL — ABNORMAL HIGH (ref 6–23)
CO2: 30 meq/L (ref 19–32)
CREATININE: 1.04 mg/dL (ref 0.40–1.20)
Calcium: 9.5 mg/dL (ref 8.4–10.5)
Chloride: 104 mEq/L (ref 96–112)
GFR: 53.09 mL/min — AB (ref >60.00)
Glucose, Bld: 86 mg/dL (ref 70–99)
Potassium: 4.1 mEq/L (ref 3.5–5.1)
Sodium: 141 mEq/L (ref 135–145)
TOTAL PROTEIN: 6.7 g/dL (ref 6.0–8.3)

## 2016-12-02 LAB — CBC WITH DIFFERENTIAL/PLATELET
Basophils Absolute: 0.1 10*3/uL (ref 0.0–0.1)
Basophils Relative: 0.9 % (ref 0.0–3.0)
EOS ABS: 0.1 10*3/uL (ref 0.0–0.7)
EOS PCT: 1.3 % (ref 0.0–5.0)
HCT: 45.9 % (ref 36.0–46.0)
HEMOGLOBIN: 14.8 g/dL (ref 12.0–15.0)
LYMPHS ABS: 2.7 10*3/uL (ref 0.7–4.0)
Lymphocytes Relative: 37.1 % (ref 12.0–46.0)
MCHC: 32.3 g/dL (ref 30.0–36.0)
MCV: 95.7 fl (ref 78.0–100.0)
MONO ABS: 0.8 10*3/uL (ref 0.1–1.0)
Monocytes Relative: 11.5 % (ref 3.0–12.0)
NEUTROS PCT: 49.2 % (ref 43.0–77.0)
Neutro Abs: 3.5 10*3/uL (ref 1.4–7.7)
PLATELETS: 190 10*3/uL (ref 150.0–400.0)
RBC: 4.8 Mil/uL (ref 3.87–5.11)
RDW: 13.9 % (ref 11.5–15.5)
WBC: 7.1 10*3/uL (ref 4.0–10.5)

## 2016-12-02 LAB — TSH: TSH: 4.27 u[IU]/mL (ref 0.35–4.50)

## 2016-12-02 NOTE — Patient Instructions (Addendum)
You are underweight!!!  I want you to add protein to your breakfast ( toast and cereal  Do not have enough protein )  You can  Do this by subtituting  Ensure or Boost for your cereal milk ,   Or you can try Premier Protein  or Muscle Milk (similar,  More protein thatn Ensure)   If  This ruins your cereal taste,  Drink a protein shake  separately  Once daily .     You should drink  A minimum of 3  8 ounce glasses of water daily  To keep your kidneys  Healthy

## 2016-12-02 NOTE — Progress Notes (Signed)
Subjective:  Patient ID: Courtney Newton, female    DOB: 12-12-1926  Age: 83 y.o. MRN: [id number]  CC: The primary encounter diagnosis was Weight loss. Diagnoses of Vitamin D deficiency, Underweight, and Memory loss, short term were also pertinent to this visit.  HPI Courtney Newton presents for follow up on  Multiple conditions including Underweight , chronic hip and neck pain and cognitive changes.   Sleeping 6 hours per night,  rarely naps  Wakes up at 4 am.    Bowels very regular  occasioal urge incontinence of both .  Wears a pad  Appetite fine but still underweight.  Diet reviewed.  Low in protein   No recent falls. Ambulates without a walker ,  Walks inside her home and yatrd for exercise.     Outpatient Medications Prior to Visit  Medication Sig Dispense Refill  . Calcium Carb-Cholecalciferol (CALCIUM 600 + D PO) Take 1 tablet by mouth daily.    . Multiple Vitamins-Minerals (MULTIVITAMIN PO) Take 1 tablet by mouth daily.     Facility-Administered Medications Prior to Visit  Medication Dose Route Frequency Provider Last Rate Last Dose  . pneumococcal 23 valent vaccine (PNU-IMMUNE) injection 0.5 mL  0.5 mL Intramuscular Once Sherlene Shams, MD        Review of Systems;  Patient denies headache, fevers, malaise, unintentional weight loss, skin rash, eye pain, sinus congestion and sinus pain, sore throat, dysphagia,  hemoptysis , cough, dyspnea, wheezing, chest pain, palpitations, orthopnea, edema, abdominal pain, nausea, melena, diarrhea, constipation, flank pain, dysuria, hematuria, urinary  Frequency, nocturia, numbness, tingling, seizures,  Focal weakness, Loss of consciousness,  Tremor, insomnia, depression, anxiety, and suicidal ideation.      Objective:  BP 138/76 (BP Location: Left Arm, Patient Position: Sitting, Cuff Size: Normal)   Pulse 68   Temp 97.6 F (36.4 C) (Oral)   Resp 14   Ht  (1.6 m)   Wt 96 lb 6.4 oz (43.7 kg)   SpO2 97%   BMI 17.08 kg/m     BP Readings from Last 3 Encounters:  12/02/16 138/76  08/24/16 128/64  05/27/16 124/78    Wt Readings from Last 3 Encounters:  12/02/16 96 lb 6.4 oz (43.7 kg)  08/24/16 96 lb 12.8 oz (43.9 kg)  05/27/16 100 lb 9.6 oz (45.6 kg)    General appearance: alert, cooperative and appears stated age Ears: normal TM's and external ear canals both ears Throat: lips, mucosa, and tongue normal; teeth and gums normal Neck: no adenopathy, no carotid bruit, supple, symmetrical, trachea midline and thyroid not enlarged, symmetric, no tenderness/mass/nodules Back: symmetric, no curvature. ROM normal. No CVA tenderness. Lungs: clear to auscultation bilaterally Heart: regular rate and rhythm, S1, S2 normal, no murmur, click, rub or gallop Abdomen: soft, non-tender; bowel sounds normal; no masses,  no organomegaly Pulses: 2+ and symmetric Skin: Skin color, texture, turgor normal. No rashes or lesions Lymph nodes: Cervical, supraclavicular, and axillary nodes normal. MMSE 25/30  No results found for: HGBA1C  Lab Results  Component Value Date   CREATININE 1.04 12/02/2016   CREATININE 1.02 11/28/2015   CREATININE 0.84 07/11/2015    Lab Results  Component Value Date   WBC 7.1 12/02/2016   HGB 14.8 12/02/2016   HCT 45.9 12/02/2016   PLT 190.0 12/02/2016   GLUCOSE 86 12/02/2016   CHOL 151 07/08/2015   TRIG 67 07/08/2015   HDL 43 07/08/2015   LDLDIRECT 137.8 04/06/2012   LDLCALC 95 07/08/2015  ALT 12 12/02/2016   AST 24 12/02/2016   NA 141 12/02/2016   K 4.1 12/02/2016   CL 104 12/02/2016   CREATININE 1.04 12/02/2016   BUN 27 (H) 12/02/2016   CO2 30 12/02/2016   TSH 4.27 12/02/2016    No results found.  Assessment & Plan:   Problem List Items Addressed This Visit    Memory loss, short term    She has  short term memory loss but continues to live independently without incident. She is well dressed and her weight is stable.       Underweight     I have reviewed her diet and  recommended that she increase her protein and fat intake while monitoring her carbohydrates.   Lab Results  Component Value Date   TSH 4.27 12/02/2016   Lab Results  Component Value Date   ALT 12 12/02/2016   AST 24 12/02/2016   ALKPHOS 70 12/02/2016   BILITOT 0.5 12/02/2016          Other Visit Diagnoses    Weight loss    -  Primary   Relevant Orders   Comprehensive metabolic panel (Completed)   TSH (Completed)   CBC with Differential/Platelet (Completed)   Vitamin D deficiency       Relevant Orders   VITAMIN D 25 Hydroxy (Vit-D Deficiency, Fractures) (Completed)     A total of 25 minutes of face to face time was spent with patient more than half of which was spent in counselling about the above mentioned conditions  and coordination of care  I have discontinued Ms. Scholer's Multiple Vitamins-Minerals (MULTIVITAMIN PO) and Calcium Carb-Cholecalciferol (CALCIUM 600 + D PO). We will continue to administer pneumococcal 23 valent vaccine.  No orders of the defined types were placed in this encounter.   Medications Discontinued During This Encounter  Medication Reason  . Calcium Carb-Cholecalciferol (CALCIUM 600 + D PO) Patient has not taken in last 30 days  . Multiple Vitamins-Minerals (MULTIVITAMIN PO) Patient has not taken in last 30 days    Follow-up: Return in about 6 months (around 06/01/2017).   Sherlene ShamsULLO, Other Atienza L, MD

## 2016-12-04 ENCOUNTER — Encounter: Payer: Self-pay | Admitting: Internal Medicine

## 2016-12-04 ENCOUNTER — Telehealth: Payer: Self-pay | Admitting: Internal Medicine

## 2016-12-04 NOTE — Assessment & Plan Note (Signed)
I have reviewed her diet and recommended that she increase her protein and fat intake while monitoring her carbohydrates.   Lab Results  Component Value Date   TSH 4.27 12/02/2016   Lab Results  Component Value Date   ALT 12 12/02/2016   AST 24 12/02/2016   ALKPHOS 70 12/02/2016   BILITOT 0.5 12/02/2016

## 2016-12-04 NOTE — Telephone Encounter (Signed)
Your CBC, thyroid ,vitamin D,  liver and kidney function are normal.  You are not anemic.  Marland Kitchen Please Plan to repeat fasting labs in 6 months.   Regards,  Dr. Darrick Huntsman

## 2016-12-04 NOTE — Assessment & Plan Note (Signed)
She has  short term memory loss but continues to live independently without incident. She is well dressed and her weight is stable.

## 2016-12-06 NOTE — Telephone Encounter (Signed)
Spoke with pt and informed her of her lab results. Pt gave a verbal understanding. Also scheduled pt a lab appt a few days before her 6 month follow up with Dr. Darrick Huntsman. Pt is aware of appt date and time.

## 2017-01-20 ENCOUNTER — Ambulatory Visit (INDEPENDENT_AMBULATORY_CARE_PROVIDER_SITE_OTHER): Payer: PPO | Admitting: *Deleted

## 2017-01-20 DIAGNOSIS — Z23 Encounter for immunization: Secondary | ICD-10-CM | POA: Diagnosis not present

## 2017-04-25 DIAGNOSIS — S4991XA Unspecified injury of right shoulder and upper arm, initial encounter: Secondary | ICD-10-CM | POA: Diagnosis not present

## 2017-04-25 DIAGNOSIS — S42291A Other displaced fracture of upper end of right humerus, initial encounter for closed fracture: Secondary | ICD-10-CM | POA: Diagnosis not present

## 2017-04-27 DIAGNOSIS — S42291A Other displaced fracture of upper end of right humerus, initial encounter for closed fracture: Secondary | ICD-10-CM | POA: Diagnosis not present

## 2017-04-27 DIAGNOSIS — M25511 Pain in right shoulder: Secondary | ICD-10-CM | POA: Diagnosis not present

## 2017-04-29 DIAGNOSIS — K5790 Diverticulosis of intestine, part unspecified, without perforation or abscess without bleeding: Secondary | ICD-10-CM | POA: Diagnosis not present

## 2017-04-29 DIAGNOSIS — Z9181 History of falling: Secondary | ICD-10-CM | POA: Diagnosis not present

## 2017-04-29 DIAGNOSIS — M810 Age-related osteoporosis without current pathological fracture: Secondary | ICD-10-CM | POA: Diagnosis not present

## 2017-04-29 DIAGNOSIS — S42201D Unspecified fracture of upper end of right humerus, subsequent encounter for fracture with routine healing: Secondary | ICD-10-CM | POA: Diagnosis not present

## 2017-05-03 DIAGNOSIS — M810 Age-related osteoporosis without current pathological fracture: Secondary | ICD-10-CM | POA: Diagnosis not present

## 2017-05-03 DIAGNOSIS — S42201D Unspecified fracture of upper end of right humerus, subsequent encounter for fracture with routine healing: Secondary | ICD-10-CM | POA: Diagnosis not present

## 2017-05-03 DIAGNOSIS — Z9181 History of falling: Secondary | ICD-10-CM | POA: Diagnosis not present

## 2017-05-03 DIAGNOSIS — K5790 Diverticulosis of intestine, part unspecified, without perforation or abscess without bleeding: Secondary | ICD-10-CM | POA: Diagnosis not present

## 2017-05-04 DIAGNOSIS — K5790 Diverticulosis of intestine, part unspecified, without perforation or abscess without bleeding: Secondary | ICD-10-CM | POA: Diagnosis not present

## 2017-05-04 DIAGNOSIS — S42201D Unspecified fracture of upper end of right humerus, subsequent encounter for fracture with routine healing: Secondary | ICD-10-CM | POA: Diagnosis not present

## 2017-05-04 DIAGNOSIS — M810 Age-related osteoporosis without current pathological fracture: Secondary | ICD-10-CM | POA: Diagnosis not present

## 2017-05-04 DIAGNOSIS — Z9181 History of falling: Secondary | ICD-10-CM | POA: Diagnosis not present

## 2017-05-09 DIAGNOSIS — S42201D Unspecified fracture of upper end of right humerus, subsequent encounter for fracture with routine healing: Secondary | ICD-10-CM | POA: Diagnosis not present

## 2017-05-09 DIAGNOSIS — Z9181 History of falling: Secondary | ICD-10-CM | POA: Diagnosis not present

## 2017-05-09 DIAGNOSIS — K5790 Diverticulosis of intestine, part unspecified, without perforation or abscess without bleeding: Secondary | ICD-10-CM | POA: Diagnosis not present

## 2017-05-09 DIAGNOSIS — M810 Age-related osteoporosis without current pathological fracture: Secondary | ICD-10-CM | POA: Diagnosis not present

## 2017-05-10 DIAGNOSIS — M810 Age-related osteoporosis without current pathological fracture: Secondary | ICD-10-CM | POA: Diagnosis not present

## 2017-05-10 DIAGNOSIS — S42201D Unspecified fracture of upper end of right humerus, subsequent encounter for fracture with routine healing: Secondary | ICD-10-CM | POA: Diagnosis not present

## 2017-05-10 DIAGNOSIS — Z9181 History of falling: Secondary | ICD-10-CM | POA: Diagnosis not present

## 2017-05-10 DIAGNOSIS — K5790 Diverticulosis of intestine, part unspecified, without perforation or abscess without bleeding: Secondary | ICD-10-CM | POA: Diagnosis not present

## 2017-05-12 DIAGNOSIS — S42294D Other nondisplaced fracture of upper end of right humerus, subsequent encounter for fracture with routine healing: Secondary | ICD-10-CM | POA: Diagnosis not present

## 2017-05-12 DIAGNOSIS — S52124A Nondisplaced fracture of head of right radius, initial encounter for closed fracture: Secondary | ICD-10-CM | POA: Diagnosis not present

## 2017-05-12 DIAGNOSIS — M25521 Pain in right elbow: Secondary | ICD-10-CM | POA: Diagnosis not present

## 2017-05-16 DIAGNOSIS — Z9181 History of falling: Secondary | ICD-10-CM | POA: Diagnosis not present

## 2017-05-16 DIAGNOSIS — S42201D Unspecified fracture of upper end of right humerus, subsequent encounter for fracture with routine healing: Secondary | ICD-10-CM | POA: Diagnosis not present

## 2017-05-16 DIAGNOSIS — K5790 Diverticulosis of intestine, part unspecified, without perforation or abscess without bleeding: Secondary | ICD-10-CM | POA: Diagnosis not present

## 2017-05-16 DIAGNOSIS — M810 Age-related osteoporosis without current pathological fracture: Secondary | ICD-10-CM | POA: Diagnosis not present

## 2017-05-23 DIAGNOSIS — K5790 Diverticulosis of intestine, part unspecified, without perforation or abscess without bleeding: Secondary | ICD-10-CM | POA: Diagnosis not present

## 2017-05-23 DIAGNOSIS — M810 Age-related osteoporosis without current pathological fracture: Secondary | ICD-10-CM | POA: Diagnosis not present

## 2017-05-23 DIAGNOSIS — Z9181 History of falling: Secondary | ICD-10-CM | POA: Diagnosis not present

## 2017-05-23 DIAGNOSIS — S42201D Unspecified fracture of upper end of right humerus, subsequent encounter for fracture with routine healing: Secondary | ICD-10-CM | POA: Diagnosis not present

## 2017-05-30 ENCOUNTER — Telehealth: Payer: Self-pay | Admitting: Radiology

## 2017-05-30 ENCOUNTER — Other Ambulatory Visit (INDEPENDENT_AMBULATORY_CARE_PROVIDER_SITE_OTHER): Payer: PPO

## 2017-05-30 DIAGNOSIS — E559 Vitamin D deficiency, unspecified: Secondary | ICD-10-CM | POA: Diagnosis not present

## 2017-05-30 DIAGNOSIS — S42201D Unspecified fracture of upper end of right humerus, subsequent encounter for fracture with routine healing: Secondary | ICD-10-CM | POA: Diagnosis not present

## 2017-05-30 DIAGNOSIS — K5790 Diverticulosis of intestine, part unspecified, without perforation or abscess without bleeding: Secondary | ICD-10-CM | POA: Diagnosis not present

## 2017-05-30 DIAGNOSIS — R636 Underweight: Secondary | ICD-10-CM

## 2017-05-30 DIAGNOSIS — M810 Age-related osteoporosis without current pathological fracture: Secondary | ICD-10-CM | POA: Diagnosis not present

## 2017-05-30 DIAGNOSIS — Z9181 History of falling: Secondary | ICD-10-CM | POA: Diagnosis not present

## 2017-05-30 LAB — COMPREHENSIVE METABOLIC PANEL
ALBUMIN: 4.2 g/dL (ref 3.5–5.2)
ALT: 12 U/L (ref 0–35)
AST: 23 U/L (ref 0–37)
Alkaline Phosphatase: 98 U/L (ref 39–117)
BUN: 24 mg/dL — ABNORMAL HIGH (ref 6–23)
CALCIUM: 9.8 mg/dL (ref 8.4–10.5)
CHLORIDE: 102 meq/L (ref 96–112)
CO2: 33 meq/L — AB (ref 19–32)
CREATININE: 0.84 mg/dL (ref 0.40–1.20)
GFR: 67.85 mL/min (ref >60.00)
Glucose, Bld: 100 mg/dL — ABNORMAL HIGH (ref 70–99)
Potassium: 4 mEq/L (ref 3.5–5.1)
Sodium: 142 mEq/L (ref 135–145)
Total Bilirubin: 0.6 mg/dL (ref 0.2–1.2)
Total Protein: 6.7 g/dL (ref 6.0–8.3)

## 2017-05-30 NOTE — Addendum Note (Signed)
Addended by: Sherlene ShamsULLO, Roston Grunewald L on: 05/30/2017 10:01 AM   Modules accepted: Orders

## 2017-05-30 NOTE — Telephone Encounter (Signed)
Pt coming in for labs today , please place future orders. Thank you.  

## 2017-05-31 LAB — VITAMIN D 25 HYDROXY (VIT D DEFICIENCY, FRACTURES): VITD: 43.02 ng/mL (ref 30.00–100.00)

## 2017-06-01 ENCOUNTER — Ambulatory Visit (INDEPENDENT_AMBULATORY_CARE_PROVIDER_SITE_OTHER): Payer: PPO | Admitting: Internal Medicine

## 2017-06-01 ENCOUNTER — Encounter: Payer: Self-pay | Admitting: Internal Medicine

## 2017-06-01 DIAGNOSIS — R609 Edema, unspecified: Secondary | ICD-10-CM | POA: Diagnosis not present

## 2017-06-01 DIAGNOSIS — R413 Other amnesia: Secondary | ICD-10-CM | POA: Diagnosis not present

## 2017-06-01 DIAGNOSIS — M8000XA Age-related osteoporosis with current pathological fracture, unspecified site, initial encounter for fracture: Secondary | ICD-10-CM | POA: Diagnosis not present

## 2017-06-01 MED ORDER — FUROSEMIDE 20 MG PO TABS: 20.0000 mg | ORAL_TABLET | Freq: Every day | ORAL | 0 refills | Status: DC

## 2017-06-01 NOTE — Patient Instructions (Addendum)
No more advil due to blood pressure issues and fluid retention.  Use tylenol,  Up to 4 tablets daily   I am prescribing furosemide (lasix ) to take  once daily in the morning for 3 days,  Then stop taking it.   Walking daily  1- 2 trips  for 15 minutes each is  recommended   Neurology evaluation for cognitive loss   Osteoporosis:  You need 2 calcium supplements daily  Plus 600 mg of calcium through diet   2000 Ius of Vitamin D recommended to help bone heal   Ok to use colace (docusate) , a stool softener,  If constipation becomes an issue  Repeat DEXA to be done and Petition for Prolia  In process for  treatment of osteoporosis

## 2017-06-01 NOTE — Progress Notes (Signed)
Subjective:  Patient ID: Courtney MinksMarilyn R Drozdowski, female    DOB: [birth year]  Age: 84 y.o. MRN: [id number]  CC: Diagnoses of Edema, unspecified type, Memory loss, short term, and Age-related osteoporosis with current pathological fracture, initial encounter were pertinent to this visit.  HPI Courtney Newton presents for follow up on multiple issues.   She is accompanied by her next door neighbor and her pastor. She recently sustained a humeral (proximal) fracture of the right arm while walking her neighbor's dog that yanked her and made her fall forward.  This occurred 4 or 5 weeks ago.  Marland Kitchen. [facility], followed by Ortho 2 days later.  Arm was placed in a Cloth sling,  Then a velcro body brace to limit movement more.    Lots of forearm edema.   Has has [facility] PT , Charity fundraiserN and Child psychotherapistsocial worker  Started passive ROM on Monday with OT.  Sees Orthopedics this Friday .  She  Has osteoprosois by last DEXA 2013. Taking calcium,  But has been deferring therapy due to cost of Prolia    Dementia;  Lives alone. No prior neurology evaluation.  Continued independent living becoming problematic due to injry.  Initially  Had 24 hour paid care,  Now has paid care about 8 hours daily via community caregivers.  [facility] .  Wants to continue living in her home.   Has had a home health RN who has been checking BP and has noted an Elevated BP for the past week along with Increased LE edema  (3 lb weightt gain) .  Gaylyn RongHa been using advil for pain control.   Outpatient Medications Prior to Visit  Medication Sig Dispense Refill  . Calcium Carbonate-Vit D-Min (CALCIUM 1200 PO) Take 1 tablet by mouth daily.     Facility-Administered Medications Prior to Visit  Medication Dose Route Frequency Provider Last Rate Last Dose  . pneumococcal 23 valent vaccine (PNU-IMMUNE) injection 0.5 mL  0.5 mL Intramuscular Once Sherlene Shamsullo, Zephyra Bernardi L, MD        Review of Systems;  Patient denies headache, fevers, malaise,  unintentional weight loss, skin rash, eye pain, sinus congestion and sinus pain, sore throat, dysphagia,  hemoptysis , cough, dyspnea, wheezing, chest pain, palpitations, orthopnea, edema, abdominal pain, nausea, melena, diarrhea, constipation, flank pain, dysuria, hematuria, urinary  Frequency, nocturia, numbness, tingling, seizures,  Focal weakness, Loss of consciousness,  Tremor, insomnia, depression, anxiety, and suicidal ideation.      Objective:  BP (!) 154/74 (BP Location: Left Arm, Patient Position: Sitting, Cuff Size: Normal)   Pulse 78   Temp (!) 97.3 F (36.3 C) (Oral)   Resp 16   Ht 5\' 3"  (1.6 m)   Wt 99 lb 6.4 oz (45.1 kg)   SpO2 95%   BMI 17.61 kg/m   BP Readings from Last 3 Encounters:  06/01/17 (!) 154/74  12/02/16 138/76  08/24/16 128/64    Wt Readings from Last 3 Encounters:  06/01/17 99 lb 6.4 oz (45.1 kg)  12/02/16 96 lb 6.4 oz (43.7 kg)  08/24/16 96 lb 12.8 oz (43.9 kg)    General appearance: alert, cooperative and appears stated age Ears: normal TM's and external ear canals both ears Throat: lips, mucosa, and tongue normal; teeth and gums normal Neck: no adenopathy, no carotid bruit, supple, symmetrical, trachea midline and thyroid not enlarged, symmetric, no tenderness/mass/nodules Back: symmetric, no curvature. ROM normal. No CVA tenderness. Lungs: clear to auscultation bilaterally Heart: regular rate  and rhythm, S1, S2 normal, no murmur, click, rub or gallop Abdomen: soft, non-tender; bowel sounds normal; no masses,  no organomegaly Pulses: 2+ and symmetric Skin: Skin color, texture, turgor normal. No rashes or lesions Lymph nodes: Cervical, supraclavicular, and axillary nodes normal.  No results found for: HGBA1C  Lab Results  Component Value Date   CREATININE 0.84 05/30/2017   CREATININE 1.04 12/02/2016   CREATININE 1.02 11/28/2015    Lab Results  Component Value Date   WBC 7.1 12/02/2016   HGB 14.8 12/02/2016   HCT 45.9 12/02/2016    PLT 190.0 12/02/2016   GLUCOSE 100 (H) 05/30/2017   CHOL 151 07/08/2015   TRIG 67 07/08/2015   HDL 43 07/08/2015   LDLDIRECT 137.8 04/06/2012   LDLCALC 95 07/08/2015   ALT 12 05/30/2017   AST 23 05/30/2017   NA 142 05/30/2017   K 4.0 05/30/2017   CL 102 05/30/2017   CREATININE 0.84 05/30/2017   BUN 24 (H) 05/30/2017   CO2 33 (H) 05/30/2017   TSH 4.27 12/02/2016    No results found.  Assessment & Plan:   Problem List Items Addressed This Visit    Edema    Multifactorial.  Resume daily walks 2 times daily.  Elevated legs when resting. Short term furosemide  X 3 days,  Suspend NSAIDS      Memory loss, short term    Referral to neurology requested by caregivers,       Osteoporosis    Will repeat attempts to get prolia authorized.  calcium and vitamin d requirements reviewed       Relevant Medications   Calcium Carbonate-Vit D-Min (CALCIUM 1200 PO)     A total of 40 minutes was spent with patient more than half of which was spent in counseling patient on the above mentioned issues , reviewing and explaining recent labs and imaging studies done, and coordination of care.  I am having Guadalupe Dawn. Vandall start on furosemide. I am also having her maintain her Calcium Carbonate-Vit D-Min (CALCIUM 1200 PO). We will continue to administer pneumococcal 23 valent vaccine.  Meds ordered this encounter  Medications  . furosemide (LASIX) 20 MG tablet    Sig: Take 1 tablet (20 mg total) by mouth daily. In the morning For 3 days,  Then STOP    Dispense:  30 tablet    Refill:  0    There are no discontinued medications.  Follow-up: Return in about 1 week (around 06/08/2017) for 15 MINUTE VISIT OK IF NOTHING ELSE AVAILABLE .   Sherlene Shams, MD

## 2017-06-03 DIAGNOSIS — S42291A Other displaced fracture of upper end of right humerus, initial encounter for closed fracture: Secondary | ICD-10-CM | POA: Diagnosis not present

## 2017-06-03 DIAGNOSIS — M25521 Pain in right elbow: Secondary | ICD-10-CM | POA: Diagnosis not present

## 2017-06-03 DIAGNOSIS — M79621 Pain in right upper arm: Secondary | ICD-10-CM | POA: Diagnosis not present

## 2017-06-03 DIAGNOSIS — S52124D Nondisplaced fracture of head of right radius, subsequent encounter for closed fracture with routine healing: Secondary | ICD-10-CM | POA: Diagnosis not present

## 2017-06-04 DIAGNOSIS — R609 Edema, unspecified: Secondary | ICD-10-CM | POA: Insufficient documentation

## 2017-06-04 NOTE — Assessment & Plan Note (Signed)
Referral to neurology requested by caregivers,

## 2017-06-04 NOTE — Assessment & Plan Note (Addendum)
Multifactorial.  Resume daily walks 2 times daily.  Elevated legs when resting. Short term furosemide  X 3 days,  Suspend NSAIDS

## 2017-06-04 NOTE — Assessment & Plan Note (Addendum)
Will repeat attempts to get prolia authorized.  calcium and vitamin d requirements reviewed

## 2017-06-06 ENCOUNTER — Telehealth: Payer: Self-pay | Admitting: Internal Medicine

## 2017-06-06 DIAGNOSIS — M810 Age-related osteoporosis without current pathological fracture: Secondary | ICD-10-CM | POA: Diagnosis not present

## 2017-06-06 DIAGNOSIS — Z9181 History of falling: Secondary | ICD-10-CM | POA: Diagnosis not present

## 2017-06-06 DIAGNOSIS — S42201D Unspecified fracture of upper end of right humerus, subsequent encounter for fracture with routine healing: Secondary | ICD-10-CM | POA: Diagnosis not present

## 2017-06-06 DIAGNOSIS — K5790 Diverticulosis of intestine, part unspecified, without perforation or abscess without bleeding: Secondary | ICD-10-CM | POA: Diagnosis not present

## 2017-06-06 NOTE — Progress Notes (Signed)
Yes but in the past patient has refused due to cost, I have resubmitted for this year but have not received price yet.

## 2017-06-06 NOTE — Telephone Encounter (Signed)
Insurance verification for Prolia filed on [employer] Portal. 

## 2017-06-08 ENCOUNTER — Ambulatory Visit: Payer: PPO | Admitting: Internal Medicine

## 2017-06-08 ENCOUNTER — Telehealth: Payer: Self-pay

## 2017-06-08 NOTE — Telephone Encounter (Signed)
Please advise 

## 2017-06-08 NOTE — Telephone Encounter (Signed)
yes

## 2017-06-08 NOTE — Telephone Encounter (Signed)
Copied from CRM [id number]. Topic: Appointment Scheduling - Scheduling Inquiry for Clinic >> Jun 08, 2017  3:02 PM Cipriano Newton, Courtney S wrote: Reason for CRM:  pt. Car broke down and can not come today.  Dr. Melina Schoolsullo's appt is 06/17/17 at 4:30.  Is it ok for her to wait this long.

## 2017-06-13 DIAGNOSIS — K5790 Diverticulosis of intestine, part unspecified, without perforation or abscess without bleeding: Secondary | ICD-10-CM | POA: Diagnosis not present

## 2017-06-13 DIAGNOSIS — S42201D Unspecified fracture of upper end of right humerus, subsequent encounter for fracture with routine healing: Secondary | ICD-10-CM | POA: Diagnosis not present

## 2017-06-13 DIAGNOSIS — M810 Age-related osteoporosis without current pathological fracture: Secondary | ICD-10-CM | POA: Diagnosis not present

## 2017-06-13 DIAGNOSIS — Z9181 History of falling: Secondary | ICD-10-CM | POA: Diagnosis not present

## 2017-06-17 ENCOUNTER — Ambulatory Visit (INDEPENDENT_AMBULATORY_CARE_PROVIDER_SITE_OTHER): Payer: PPO | Admitting: Internal Medicine

## 2017-06-17 VITALS — BP 144/88 | HR 87 | Temp 98.2°F | Resp 18 | Wt 97.2 lb

## 2017-06-17 DIAGNOSIS — M8000XA Age-related osteoporosis with current pathological fracture, unspecified site, initial encounter for fracture: Secondary | ICD-10-CM | POA: Diagnosis not present

## 2017-06-17 DIAGNOSIS — R636 Underweight: Secondary | ICD-10-CM | POA: Diagnosis not present

## 2017-06-17 DIAGNOSIS — I10 Essential (primary) hypertension: Secondary | ICD-10-CM | POA: Diagnosis not present

## 2017-06-17 DIAGNOSIS — Z9181 History of falling: Secondary | ICD-10-CM

## 2017-06-17 DIAGNOSIS — R609 Edema, unspecified: Secondary | ICD-10-CM | POA: Diagnosis not present

## 2017-06-17 DIAGNOSIS — G3184 Mild cognitive impairment, so stated: Secondary | ICD-10-CM

## 2017-06-17 MED ORDER — TELMISARTAN 20 MG PO TABS: 20.0000 mg | ORAL_TABLET | Freq: Every day | ORAL | 1 refills | Status: DC

## 2017-06-17 NOTE — Patient Instructions (Addendum)
You can use the furosemide once or twice  per week but not daily ,  For swelling   For blood pressure  I am prescribing telmisartan once daily    Please get some compression knee highs to wear every day , starting in the morning  Low  Level compression is fine    Elevate your legs whenever you are sitting if possible   TO KEEP YOU SAFE AND INDEPENDENT:   I recommend installing a safety bar in the shower so you can grab it if you lose your balance    I recommend a raised toilet seat with bars to  Help you get off the commode safely   I recommend getting the Life Alert necklace to wear AT ALL TIMES

## 2017-06-17 NOTE — Progress Notes (Signed)
Subjective:  Patient ID: Courtney Newton, female    DOB: 30-Jan-1927  Age: 84 y.o. MRN: [id number]  CC: The primary encounter diagnosis was Age-related osteoporosis with current pathological fracture, initial encounter. Diagnoses of Essential hypertension, Underweight, Edema, unspecified type, Mild cognitive impairment with memory loss, and History of fall within past 90 days were also pertinent to this visit.  HPI Courtney Newton presents for one week follow up on treatment of lower extremity edema and other issus including elevated blood pressure and recent  humeral fracture.  On March 13 she was prescribed furosemide for a limited period  To managed bilateral lower extremity edema accompanied by a 3 lb weight gain and elevated blood pressure .  She is accompanied by her neighbor today . Patient does not remember how long she took the furosemide due to cognitive decline .  She is no longer wearing a sling/body brace since Orthopedic follow up.  She continues to live alone and relies on assistance from community members and home health which is providing limited assistance at this point.  Patient states that she prepares her own meals,  An dis able to shower without assistance.  She does not wear a Life Alert and does not prepare her own pill box.  Her fall occurred as the result of walking her neighbor's dog who became uncontrolled and yanked her off her feet.  She has not had any other witnessed falls.     She remains adamant about remaining independent for "as long as possible, ." yet is resistant to the idea of installing a shower stall safety bar and a raise toilet seat because "I don't need them." she correctly states that "falls can happen  even at [facility]"    Outpatient Medications Prior to Visit  Medication Sig Dispense Refill  . Calcium Carbonate-Vit D-Min (CALCIUM 1200 PO) Take 1 tablet by mouth daily.    . furosemide (LASIX) 20 MG tablet Take 1 tablet (20 mg total) by mouth daily. In  the morning For 3 days,  Then STOP 30 tablet 0   Facility-Administered Medications Prior to Visit  Medication Dose Route Frequency Provider Last Rate Last Dose  . pneumococcal 23 valent vaccine (PNU-IMMUNE) injection 0.5 mL  0.5 mL Intramuscular Once Sherlene Shams, MD        Review of Systems;  Patient denies headache, fevers, malaise, unintentional weight loss, skin rash, eye pain, sinus congestion and sinus pain, sore throat, dysphagia,  hemoptysis , cough, dyspnea, wheezing, chest pain, palpitations, orthopnea, edema, abdominal pain, nausea, melena, diarrhea, constipation, flank pain, dysuria, hematuria, urinary  Frequency, nocturia, numbness, tingling, seizures,  Focal weakness, Loss of consciousness,  Tremor, insomnia, depression, anxiety, and suicidal ideation.      Objective:  BP (!) 144/88 (BP Location: Left Arm, Patient Position: Sitting, Cuff Size: Normal)   Pulse 87   Temp 98.2 F (36.8 C) (Oral)   Resp 18   Wt 97 lb 3.2 oz (44.1 kg)   SpO2 93%   BMI 17.22 kg/m   BP Readings from Last 3 Encounters:  06/17/17 (!) 144/88  06/01/17 (!) 154/74  12/02/16 138/76    Wt Readings from Last 3 Encounters:  06/17/17 97 lb 3.2 oz (44.1 kg)  06/01/17 99 lb 6.4 oz (45.1 kg)  12/02/16 96 lb 6.4 oz (43.7 kg)    General appearance: alert, cooperative and appears stated age Neck: no adenopathy, no carotid bruit, supple, symmetrical, trachea midline and thyroid not enlarged, symmetric, no tenderness/mass/nodules Back:  symmetric, slight kyphosis . ROM normal. No CVA tenderness. Lungs: clear to auscultation bilaterally Heart: regular rate and rhythm, S1, S2 normal, no murmur, click, rub or gallop Abdomen: soft, non-tender; bowel sounds normal; no masses,  no organomegaly Pulses: 2+ and symmetric Skin: Skin color, texture, turgor normal. No rashes or lesions Lymph nodes: Cervical, supraclavicular, and axillary nodes normal. Neuro:  awake and interactive with normal mood and affect.  Higher cortical functions are normal. Speech is clear with occasional word-finding difficulty.  Extraocular movements are intact. Visual fields of both eyes are grossly intact. Sensation to light touch is grossly intact bilaterally of upper and lower extremities. Motor examination shows 4+/5 symmetric hand grip and upper extremity and 5/5 lower extremity strength. There is no pronation or drift. Gait is non-ataxic    No results found for: HGBA1C  Lab Results  Component Value Date   CREATININE 0.84 05/30/2017   CREATININE 1.04 12/02/2016   CREATININE 1.02 11/28/2015    Lab Results  Component Value Date   WBC 7.1 12/02/2016   HGB 14.8 12/02/2016   HCT 45.9 12/02/2016   PLT 190.0 12/02/2016   GLUCOSE 100 (H) 05/30/2017   CHOL 151 07/08/2015   TRIG 67 07/08/2015   HDL 43 07/08/2015   LDLDIRECT 137.8 04/06/2012   LDLCALC 95 07/08/2015   ALT 12 05/30/2017   AST 23 05/30/2017   NA 142 05/30/2017   K 4.0 05/30/2017   CL 102 05/30/2017   CREATININE 0.84 05/30/2017   BUN 24 (H) 05/30/2017   CO2 33 (H) 05/30/2017   TSH 4.27 12/02/2016    No results found.  Assessment & Plan:   Problem List Items Addressed This Visit    Underweight    The weight loss of 2 lbs is due to use of furosemide and she is now back to her baseline       Osteoporosis - Primary    With recent humeral fracture which occurred during a fall. Repeat DEXA has been ordered in order to file PA for Prolia       Relevant Orders   DG Bone Density   Mild cognitive impairment with memory loss    She has been referred to Neurology and is awaiting evaluation. She is competent to live alone  But with use of daily medications may require assistance.   Lab Results  Component Value Date   TSH 4.27 12/02/2016   Lab Results  Component Value Date   VITAMINB12 1,492 (H) 07/11/2015         Hypertension    Starting telmisartan  .  Avoiding amlodipine given recent development of LE edema requiring diuretics.   Avoiding hctz due to risk of hyponatremia.       Relevant Medications   telmisartan (MICARDIS) 20 MG tablet   History of fall within past 90 days    The fall did not occur from loss of balance  She was walking a dog and it yanked her forward.  Nevertheless,  I have strongly recommended that she obtain a Life Alert to wear since she prefers to remain living independently./alone      Edema    Resolved with 3 days of furosemide.  Advised to wear compression knee highs and elevaed legs for daily management         A total of 25 minutes of face to face time was spent with patient more than half of which was spent in counselling about the above mentioned conditions  and coordination of care  I am having Guadalupe DawnMarilyn R. Behl start on telmisartan. I am also having her maintain her Calcium Carbonate-Vit D-Min (CALCIUM 1200 PO) and furosemide. We will continue to administer pneumococcal 23 valent vaccine.  Meds ordered this encounter  Medications  . telmisartan (MICARDIS) 20 MG tablet    Sig: Take 1 tablet (20 mg total) by mouth daily.    Dispense:  90 tablet    Refill:  1    There are no discontinued medications.  Follow-up: No follow-ups on file.   Sherlene Shamseresa L Narcissus Detwiler, MD

## 2017-06-19 ENCOUNTER — Encounter: Payer: Self-pay | Admitting: Internal Medicine

## 2017-06-19 DIAGNOSIS — I10 Essential (primary) hypertension: Secondary | ICD-10-CM | POA: Insufficient documentation

## 2017-06-19 DIAGNOSIS — Z9181 History of falling: Secondary | ICD-10-CM | POA: Insufficient documentation

## 2017-06-19 NOTE — Assessment & Plan Note (Addendum)
Starting telmisartan  .  Avoiding amlodipine given recent development of LE edema requiring diuretics.  Avoiding hctz due to risk of hyponatremia.

## 2017-06-19 NOTE — Assessment & Plan Note (Signed)
The weight loss of 2 lbs is due to use of furosemide and she is now back to her baseline

## 2017-06-19 NOTE — Assessment & Plan Note (Signed)
She has been referred to Neurology and is awaiting evaluation. She is competent to live alone  But with use of daily medications may require assistance.   Lab Results  Component Value Date   TSH 4.27 12/02/2016   Lab Results  Component Value Date   VITAMINB12 1,492 (H) 07/11/2015

## 2017-06-19 NOTE — Assessment & Plan Note (Signed)
Resolved with 3 days of furosemide.  Advised to wear compression knee highs and elevaed legs for daily management

## 2017-06-19 NOTE — Assessment & Plan Note (Signed)
With recent humeral fracture which occurred during a fall. Repeat DEXA has been ordered in order to file PA for Prolia

## 2017-06-19 NOTE — Assessment & Plan Note (Signed)
The fall did not occur from loss of balance  She was walking a dog and it yanked her forward.  Nevertheless,  I have strongly recommended that she obtain a Life Alert to wear since she prefers to remain living independently./alone

## 2017-06-20 DIAGNOSIS — K5790 Diverticulosis of intestine, part unspecified, without perforation or abscess without bleeding: Secondary | ICD-10-CM | POA: Diagnosis not present

## 2017-06-20 DIAGNOSIS — M810 Age-related osteoporosis without current pathological fracture: Secondary | ICD-10-CM | POA: Diagnosis not present

## 2017-06-20 DIAGNOSIS — Z9181 History of falling: Secondary | ICD-10-CM | POA: Diagnosis not present

## 2017-06-20 DIAGNOSIS — S42201D Unspecified fracture of upper end of right humerus, subsequent encounter for fracture with routine healing: Secondary | ICD-10-CM | POA: Diagnosis not present

## 2017-06-28 ENCOUNTER — Other Ambulatory Visit: Payer: Self-pay | Admitting: Internal Medicine

## 2017-06-28 DIAGNOSIS — M810 Age-related osteoporosis without current pathological fracture: Secondary | ICD-10-CM | POA: Diagnosis not present

## 2017-06-28 DIAGNOSIS — Z9181 History of falling: Secondary | ICD-10-CM | POA: Diagnosis not present

## 2017-06-28 DIAGNOSIS — S42201D Unspecified fracture of upper end of right humerus, subsequent encounter for fracture with routine healing: Secondary | ICD-10-CM | POA: Diagnosis not present

## 2017-06-28 DIAGNOSIS — K5790 Diverticulosis of intestine, part unspecified, without perforation or abscess without bleeding: Secondary | ICD-10-CM | POA: Diagnosis not present

## 2017-07-04 DIAGNOSIS — Z9181 History of falling: Secondary | ICD-10-CM | POA: Diagnosis not present

## 2017-07-04 DIAGNOSIS — M810 Age-related osteoporosis without current pathological fracture: Secondary | ICD-10-CM | POA: Diagnosis not present

## 2017-07-04 DIAGNOSIS — S42201D Unspecified fracture of upper end of right humerus, subsequent encounter for fracture with routine healing: Secondary | ICD-10-CM | POA: Diagnosis not present

## 2017-07-04 DIAGNOSIS — K5790 Diverticulosis of intestine, part unspecified, without perforation or abscess without bleeding: Secondary | ICD-10-CM | POA: Diagnosis not present

## 2017-07-11 ENCOUNTER — Telehealth: Payer: Self-pay

## 2017-07-11 NOTE — Telephone Encounter (Signed)
Do you anything about this?

## 2017-07-11 NOTE — Telephone Encounter (Signed)
Copied from CRM [phone]. Topic: Referral - Status >> Jul 11, 2017  2:22 PM Eston Mouldavis, Cheri B wrote: Reason for CRM: Gordan PaymentKaren  lewis called to say  the referral  for  Cognitive changes, short term memory loss  has not been made, no one has contacted to set up apt.

## 2017-07-12 ENCOUNTER — Telehealth: Payer: Self-pay

## 2017-07-12 NOTE — Telephone Encounter (Signed)
Spoke with Clydie BraunKaren and advised her that the pt was scheduled for 07/04/2017 but that it was cancelled for some reason. Kelby AlineGave Karen the phone number to call Neurology and reschedule appt.

## 2017-07-12 NOTE — Telephone Encounter (Signed)
Spoke with Clydie BraunKaren when speaking with her about the neurology appt and was able to get pt scheduled for a dexa scan but the time didn't work out for her so gave Clydie BraunKaren the number to call and reschedule the appt.

## 2017-07-12 NOTE — Telephone Encounter (Signed)
According to my notes she was scheduled on 4/15 but the appointment was cancelled-it didn't say by who or why it was cancelled.  [facility].

## 2017-07-12 NOTE — Telephone Encounter (Signed)
Copied from CRM [phone]. Topic: Appointment Scheduling - Scheduling Inquiry for Clinic >> Jul 11, 2017  2:29 PM Courtney Newton, Cheri B wrote: Reason for CRM: PT needs bone density  apt  scheduled  Call karen with information

## 2017-07-13 ENCOUNTER — Other Ambulatory Visit: Payer: PPO

## 2017-07-15 ENCOUNTER — Telehealth: Payer: Self-pay | Admitting: Internal Medicine

## 2017-07-15 NOTE — Telephone Encounter (Signed)
I was able to get approval for this patient Prolia but the injection will be $ 225 every six months patient staed she would have to think about getting this injection at that price. Patient stated she would call the office when she decides.

## 2017-07-18 DIAGNOSIS — M810 Age-related osteoporosis without current pathological fracture: Secondary | ICD-10-CM | POA: Diagnosis not present

## 2017-07-18 DIAGNOSIS — Z9181 History of falling: Secondary | ICD-10-CM | POA: Diagnosis not present

## 2017-07-18 DIAGNOSIS — K5790 Diverticulosis of intestine, part unspecified, without perforation or abscess without bleeding: Secondary | ICD-10-CM | POA: Diagnosis not present

## 2017-07-18 DIAGNOSIS — S42201D Unspecified fracture of upper end of right humerus, subsequent encounter for fracture with routine healing: Secondary | ICD-10-CM | POA: Diagnosis not present

## 2017-07-27 ENCOUNTER — Ambulatory Visit (INDEPENDENT_AMBULATORY_CARE_PROVIDER_SITE_OTHER): Payer: PPO

## 2017-07-27 DIAGNOSIS — M810 Age-related osteoporosis without current pathological fracture: Secondary | ICD-10-CM | POA: Diagnosis not present

## 2017-07-27 MED ORDER — DENOSUMAB 60 MG/ML ~~LOC~~ SOSY
60.0000 mg | PREFILLED_SYRINGE | Freq: Once | SUBCUTANEOUS | Status: AC
  Administered 2017-07-27: 60 mg via SUBCUTANEOUS

## 2017-07-27 NOTE — Progress Notes (Addendum)
Patient presents today for her first Prolia injection. Left dletoid SQ. Pt voiced no concerns nor showed any signs of distress during injection.    I have reviewed the above information and agree with above.   Duncan Dull, MD

## 2017-08-03 DIAGNOSIS — S42201D Unspecified fracture of upper end of right humerus, subsequent encounter for fracture with routine healing: Secondary | ICD-10-CM | POA: Diagnosis not present

## 2017-08-09 ENCOUNTER — Ambulatory Visit
Admission: RE | Admit: 2017-08-09 | Discharge: 2017-08-09 | Disposition: A | Discharge status: Home / Self Care | Payer: PPO | Source: Ambulatory Visit | Attending: Internal Medicine | Admitting: Internal Medicine

## 2017-08-09 DIAGNOSIS — M810 Age-related osteoporosis without current pathological fracture: Secondary | ICD-10-CM | POA: Diagnosis not present

## 2017-08-09 DIAGNOSIS — M8000XA Age-related osteoporosis with current pathological fracture, unspecified site, initial encounter for fracture: Secondary | ICD-10-CM

## 2017-08-09 DIAGNOSIS — M85832 Other specified disorders of bone density and structure, left forearm: Secondary | ICD-10-CM | POA: Diagnosis not present

## 2017-08-09 DIAGNOSIS — M85851 Other specified disorders of bone density and structure, right thigh: Secondary | ICD-10-CM | POA: Diagnosis not present

## 2017-08-09 DIAGNOSIS — Z87310 Personal history of (healed) osteoporosis fracture: Secondary | ICD-10-CM | POA: Insufficient documentation

## 2017-08-12 ENCOUNTER — Telehealth: Payer: Self-pay | Admitting: Internal Medicine

## 2017-08-12 NOTE — Telephone Encounter (Signed)
Please advise 

## 2017-08-12 NOTE — Telephone Encounter (Signed)
LMTCB with [name]. Please transfer her to our office when she calls back.

## 2017-08-12 NOTE — Telephone Encounter (Signed)
Copied from CRM [id number]. Topic: Quick Communication - See Telephone Encounter >> Aug 12, 2017 11:40 AM Rudi Coco, NT wrote: CRM for notification. See Telephone encounter for: 08/12/17.  Pt. Courtney Newton calling to check for pt. Results from bone density test [name] is on [facility]

## 2017-08-16 NOTE — Telephone Encounter (Signed)
Courtney Newton returned call and Bone density scores given, Courtney Newton is very concerned patient is nottaking BP medication as she should and that patient since her fracture has healed has stopped her calcium supplement . Courtney Newton feels she cannot discuss this with PCP art visit on 08/29/17 so she wanted make PCP aware and see if there is away this can be brought up at visit without stating Courtney Newton made [place] aware.

## 2017-08-24 NOTE — Telephone Encounter (Signed)
Spoke with Clydie BraunKaren and she stated that she had already spoken with Olegario MessierKathy, RN previously in regards to the results.

## 2017-08-24 NOTE — Telephone Encounter (Signed)
LMTCB with Karen(on DPR) in regards to pt's bone density scan.

## 2017-08-25 ENCOUNTER — Ambulatory Visit: Payer: PPO

## 2017-08-29 ENCOUNTER — Ambulatory Visit (INDEPENDENT_AMBULATORY_CARE_PROVIDER_SITE_OTHER): Payer: PPO | Admitting: Internal Medicine

## 2017-08-29 ENCOUNTER — Ambulatory Visit (INDEPENDENT_AMBULATORY_CARE_PROVIDER_SITE_OTHER): Payer: PPO

## 2017-08-29 ENCOUNTER — Encounter: Payer: Self-pay | Admitting: Internal Medicine

## 2017-08-29 VITALS — BP 138/82 | HR 80 | Temp 97.4°F | Resp 12 | Ht 63.0 in | Wt 97.0 lb

## 2017-08-29 DIAGNOSIS — Z9181 History of falling: Secondary | ICD-10-CM | POA: Diagnosis not present

## 2017-08-29 DIAGNOSIS — Z0000 Encounter for general adult medical examination without abnormal findings: Secondary | ICD-10-CM

## 2017-08-29 DIAGNOSIS — R636 Underweight: Secondary | ICD-10-CM | POA: Diagnosis not present

## 2017-08-29 DIAGNOSIS — G3184 Mild cognitive impairment, so stated: Secondary | ICD-10-CM

## 2017-08-29 DIAGNOSIS — I10 Essential (primary) hypertension: Secondary | ICD-10-CM

## 2017-08-29 MED ORDER — CALCIUM CARBONATE-VITAMIN D 500-200 MG-UNIT PO TABS: 1.0000 | ORAL_TABLET | Freq: Two times a day (BID) | ORAL | 11 refills | Status: DC

## 2017-08-29 NOTE — Progress Notes (Addendum)
Subjective:   Courtney Newton is a 84 y.o. female who presents for Medicare Annual (Subsequent) preventive examination.  Review of Systems:  No ROS.  Medicare Wellness Visit. Additional risk factors are reflected in the social history. Cardiac Risk Factors include: advanced age (>6355men, 1>65 women);hypertension     Objective:     Vitals: BP 138/82 (BP Location: Left Arm, Patient Position: Sitting, Cuff Size: Normal)   Pulse 80   Temp (!) 97.4 F (36.3 C) (Oral)   Resp 12   Ht 5\' 3"  (1.6 m)   Wt 97 lb (44 kg)   SpO2 97%   BMI 17.18 kg/m   Body mass index is 17.18 kg/m.  Advanced Directives 08/29/2017 08/24/2016 08/25/2015 07/06/2015 07/06/2015  Does Patient Have a Medical Advance Directive? No No Yes No No  Type of Advance Directive - - Living will;Healthcare Power of Attorney - -  Copy of Healthcare Power of Attorney in Chart? - - No - copy requested - -  Would patient like information on creating a medical advance directive? No - Patient declined Yes (MAU/Ambulatory/Procedural Areas - Information given) - No - patient declined information No - patient declined information    Tobacco Social History   Tobacco Use  Smoking Status Never Smoker  Smokeless Tobacco Never Used     Counseling given: Not Answered   Clinical Intake:  Pre-visit preparation completed: Yes  Pain : No/denies pain     Nutritional Status: BMI <19  Underweight Diabetes: No  How often do you need to have someone help you when you read instructions, pamphlets, or other written materials from your doctor or pharmacy?: 3 - Sometimes  Interpreter Needed?: No     Past Medical History:  Diagnosis Date  . History of lumpectomy of both breasts   . Osteoporosis   . Vitamin D deficiency    Past Surgical History:  Procedure Laterality Date  . ABDOMINAL HYSTERECTOMY  1950  . APPENDECTOMY    . BILATERAL OOPHORECTOMY  1950   Family History  Problem Relation Age of Onset  . Heart disease Mother     . Heart disease Father    Social History   Socioeconomic History  . Marital status: Widowed    Spouse name: Not on file  . Number of children: Not on file  . Years of education: Not on file  . Highest education level: Not on file  Occupational History  . Not on file  Social Needs  . Financial resource strain: Not hard at all  . Food insecurity:    Worry: Never true    Inability: Never true  . Transportation needs:    Medical: No    Non-medical: No  Tobacco Use  . Smoking status: Never Smoker  . Smokeless tobacco: Never Used  Substance and Sexual Activity  . Alcohol use: Yes    Comment: 4oz with dinner  . Drug use: No  . Sexual activity: Never  Lifestyle  . Physical activity:    Days per week: Not on file    Minutes per session: Not on file  . Stress: Not on file  Relationships  . Social connections:    Talks on phone: Not on file    Gets together: Not on file    Attends religious service: Not on file    Active member of club or organization: Not on file    Attends meetings of clubs or organizations: Not on file    Relationship status: Not on file  Other Topics Concern  . Not on file  Social History Narrative  . Not on file    Outpatient Encounter Medications as of 08/29/2017  Medication Sig  . [DISCONTINUED] Calcium Carbonate-Vit D-Min (CALCIUM 1200 PO) Take 1 tablet by mouth daily.  . [DISCONTINUED] furosemide (LASIX) 20 MG tablet TAKE 1 TABLET (20 MG TOTAL) BY MOUTH DAILY. IN THE MORNING FOR 3 DAYS, THEN STOP (Patient not taking: Reported on 08/29/2017)  . [DISCONTINUED] telmisartan (MICARDIS) 20 MG tablet Take 1 tablet (20 mg total) by mouth daily. (Patient not taking: Reported on 08/29/2017)   Facility-Administered Encounter Medications as of 08/29/2017  Medication  . pneumococcal 23 valent vaccine (PNU-IMMUNE) injection 0.5 mL    Activities of Daily Living In your present state of health, do you have any difficulty performing the following activities:  08/29/2017  Hearing? Y  Comment Hearing aid, bilateral  Vision? N  Difficulty concentrating or making decisions? Y  Comment Difficulty remembering  Walking or climbing stairs? N  Dressing or bathing? N  Doing errands, shopping? N  Preparing Food and eating ? N  Using the Toilet? N  In the past six months, have you accidently leaked urine? N  Do you have problems with loss of bowel control? N  Managing your Medications? N  Managing your Finances? N  Housekeeping or managing your Housekeeping? N  Some recent data might be hidden    Patient Care Team: Sherlene Shams, MD as PCP - General (Internal Medicine)    Assessment:   This is a routine wellness examination for Courtney Newton.  The goal of the wellness visit is to assist the patient how to close the gaps in care and create a preventative care plan for the patient. She presents with her friend [name] and her pastor.  Information obtained from all. HIPAA compliant.   The roster of all physicians providing medical care to patient is listed in the Snapshot section of the chart.  Osteoporosis reviewed.   Prolia injections currently administered every 6 months.  Safety issues reviewed; Smoke and carbon monoxide detectors in the home. No firearms in the home. Wears seatbelts when driving or riding with others. No violence in the home. She does not wear or use Life Alert.   They do not have excessive sun exposure.  Discussed the need for sun protection: hats, long sleeves and the use of sunscreen if there is significant sun exposure.  Patient is alert, normal appearance.  Incorrectly identified the president of the [place], the current year, month, day and recalls of 0/3 words. Can read and draw correct time from watch face.   BMI- discussed the importance of a healthy diet, water intake and the benefits of aerobic exercise. She states she eats regularly and walks for exercise. Weight consistent from last visit.   Eye- Visual acuity not  assessed per patient preference since they have regular follow up with the ophthalmologist.  Wears corrective lenses.  Sleep patterns- Sleeps through the night without issues.    Medications-Not taking any scheduled medications.   Health maintenance gaps- closed.  Exercise Activities and Dietary recommendations Current Exercise Habits: Home exercise routine, Type of exercise: walking, Time (Minutes): 20, Frequency (Times/Week): 4, Weekly Exercise (Minutes/Week): 80, Intensity: Mild  Goals    . Healthy Lifestyle     Ensure drinks daily Walk for exercise Stay hydrated; drink plenty of fluids        Fall Risk Fall Risk  08/29/2017 08/24/2016 08/25/2015 06/24/2014 05/07/2014  Falls in the past  year? Yes No No No No  Number falls in past yr: 1 - - - -  Injury with Fall? Yes - - - -  Comment Broke her arm.  Followed by ortho and pcp.  - - - -  Follow up Education provided;Falls prevention discussed - - - -   Depression Screen PHQ 2/9 Scores 08/29/2017 08/24/2016 08/25/2015 06/24/2014  PHQ - 2 Score 0 0 0 0  PHQ- 9 Score - 0 - -     Cognitive Function MMSE - Mini Mental State Exam 08/24/2016 08/25/2015  Orientation to time 5 5  Orientation to Place 5 5  Registration 3 3  Attention/ Calculation 5 5  Recall 3 3  Language- name 2 objects 2 2  Language- repeat 1 1  Language- follow 3 step command 3 3  Language- read & follow direction 1 1  Write a sentence 1 1  Copy design 1 1  Total score 30 30     6CIT Screen 08/29/2017  What Year? 4 points  What month? 3 points  What time? 0 points  Count back from 20 0 points  Months in reverse 0 points  Repeat phrase 10 points  Total Score 17    Immunization History  Administered Date(s) Administered  . Influenza Split 01/29/2012  . Influenza, High Dose Seasonal PF 11/28/2015, 01/20/2017  . Influenza,inj,Quad PF,6+ Mos 01/01/2014  . Influenza-Unspecified 12/20/2012  . Pneumococcal Conjugate-13 07/04/2014  . Pneumococcal Polysaccharide-23  03/30/2012  . Tdap 07/19/2012  . Zoster 07/19/2012    Screening Tests Health Maintenance  Topic Date Due  . INFLUENZA VACCINE  10/20/2017  . TETANUS/TDAP  07/20/2022  . DEXA SCAN  Completed  . PNA vac Low Risk Adult  Completed       Plan:   End of life planning; Advanced aging; Advanced directives discussed.  No HCPOA/Living Will.  Additional information declined at this time. Renato Gails states they have paperwork at home and are in the process of completing.  Copy requested upon completion.   I have personally reviewed and noted the following in the patient's chart:   . Medical and social history . Use of alcohol, tobacco or illicit drugs  . Current medications and supplements . Functional ability and status . Nutritional status . Physical activity . Advanced directives . List of other physicians . Hospitalizations, surgeries, and ER visits in previous 12 months . Vitals . Screenings to include cognitive, depression, and falls . Referrals and appointments  In addition, I have reviewed and discussed with patient certain preventive protocols, quality metrics, and best practice recommendations. A written personalized care plan for preventive services as well as general preventive health recommendations were provided to patient.     OBrien-Blaney, Shalika Arntz L, LPN  1/61/0960   I have reviewed the above information and agree with above.   Duncan Dull, MD

## 2017-08-29 NOTE — Patient Instructions (Addendum)
  [name] , Thank you for taking time to come for your Medicare Wellness Visit. I appreciate your ongoing commitment to your health goals. Please review the following plan we discussed and let me know if I can assist you in the future.   Schedule eye exam  These are the goals we discussed: Goals    . Healthy Lifestyle     Ensure drinks daily Walk for exercise Stay hydrated; drink plenty of fluids        This is a list of the screening recommended for you and due dates:  Health Maintenance  Topic Date Due  . Flu Shot  10/20/2017  . Tetanus Vaccine  07/20/2022  . DEXA scan (bone density measurement)  Completed  . Pneumonia vaccines  Completed

## 2017-08-29 NOTE — Progress Notes (Signed)
Subjective:  Patient ID: Courtney Newton, female    DOB: 04/10/1926  Age: 84 y.o. MRN: [id number]  CC: Diagnoses of Underweight, History of fall within past 90 days, Mild cognitive impairment with memory loss, and Essential hypertension were pertinent to this visit.  HPI KENTRELL GUETTLER presents for FOLLOW UP ON CHRONIC ISSUES INCLUDING OSTEOPOROSIS,  HYPERTENSION AND WORSENING DEMENTIA.  She is accompanied by her neighbor and her pastor.  History provided  by neighbor.    She has not been taking her anti hypertensive medications . She has deferred getting Life Alert and neurology evaluation   Sjhe did receive her first Prolia injection for osteoporosis  Neurology rescheduled for July with dr Sherryll Burger   She refused the Meals on Wheels . Eating cereal for breakfast.  Renato Gails has discussed A/L facility transition to her at the appropriate time . She has lost 2 more lbs. Denies any falls.   Outpatient Medications Prior to Visit  Medication Sig Dispense Refill  . Calcium Carbonate-Vit D-Min (CALCIUM 1200 PO) Take 1 tablet by mouth daily.    . furosemide (LASIX) 20 MG tablet TAKE 1 TABLET (20 MG TOTAL) BY MOUTH DAILY. IN THE MORNING FOR 3 DAYS, THEN STOP (Patient not taking: Reported on 08/29/2017) 30 tablet 2  . telmisartan (MICARDIS) 20 MG tablet Take 1 tablet (20 mg total) by mouth daily. (Patient not taking: Reported on 08/29/2017) 90 tablet 1   Facility-Administered Medications Prior to Visit  Medication Dose Route Frequency Provider Last Rate Last Dose  . pneumococcal 23 valent vaccine (PNU-IMMUNE) injection 0.5 mL  0.5 mL Intramuscular Once Sherlene Shams, MD        Review of Systems;  Patient denies headache, fevers, malaise, unintentional weight loss, skin rash, eye pain, sinus congestion and sinus pain, sore throat, dysphagia,  hemoptysis , cough, dyspnea, wheezing, chest pain, palpitations, orthopnea, edema, abdominal pain, nausea, melena, diarrhea, constipation, flank pain, dysuria,  hematuria, urinary  Frequency, nocturia, numbness, tingling, seizures,  Focal weakness, Loss of consciousness,  Tremor, insomnia, depression, anxiety, and suicidal ideation.      Objective:  BP 138/82 (BP Location: Left Arm, Patient Position: Sitting, Cuff Size: Normal)   Pulse 80   Temp (!) 97.4 F (36.3 C) (Oral)   Resp 12   Ht 5\' 3"  (1.6 m)   Wt 97 lb (44 kg)   SpO2 97%   BMI 17.18 kg/m   BP Readings from Last 3 Encounters:  08/29/17 138/82  08/29/17 138/82  06/17/17 (!) 144/88    Wt Readings from Last 3 Encounters:  08/29/17 97 lb (44 kg)  08/29/17 97 lb (44 kg)  06/17/17 97 lb 3.2 oz (44.1 kg)    General appearance: alert, cooperative and appears stated age Ears: normal TM's and external ear canals both ears Throat: lips, mucosa, and tongue normal; teeth and gums normal Neck: no adenopathy, no carotid bruit, supple, symmetrical, trachea midline and thyroid not enlarged, symmetric, no tenderness/mass/nodules Back: symmetric, no curvature. ROM normal. No CVA tenderness. Lungs: clear to auscultation bilaterally Heart: regular rate and rhythm, S1, S2 normal, no murmur, click, rub or gallop Abdomen: soft, non-tender; bowel sounds normal; no masses,  no organomegaly Pulses: 2+ and symmetric Skin: Skin color, texture, turgor normal. No rashes or lesions Lymph nodes: Cervical, supraclavicular, and axillary nodes normal.  No results found for: HGBA1C  Lab Results  Component Value Date   CREATININE 0.84 05/30/2017   CREATININE 1.04 12/02/2016   CREATININE 1.02 11/28/2015    Lab  Results  Component Value Date   WBC 7.1 12/02/2016   HGB 14.8 12/02/2016   HCT 45.9 12/02/2016   PLT 190.0 12/02/2016   GLUCOSE 100 (H) 05/30/2017   CHOL 151 07/08/2015   TRIG 67 07/08/2015   HDL 43 07/08/2015   LDLDIRECT 137.8 04/06/2012   LDLCALC 95 07/08/2015   ALT 12 05/30/2017   AST 23 05/30/2017   NA 142 05/30/2017   K 4.0 05/30/2017   CL 102 05/30/2017   CREATININE 0.84  05/30/2017   BUN 24 (H) 05/30/2017   CO2 33 (H) 05/30/2017   TSH 4.27 12/02/2016    Dg Bone Density  Result Date: 08/09/2017 EXAM: DUAL X-RAY ABSORPTIOMETRY (DXA) FOR BONE MINERAL DENSITY IMPRESSION: Dear Dr. Darrick Huntsman, Your patient Courtney Newton completed a BMD test on 08/09/2017 using the Lunar iDXA DXA System (analysis version: 14.10) manufactured by [employer]. The following summarizes the results of our evaluation. PATIENT BIOGRAPHICAL: Name: Belvia, Gotschall Patient ID:  [id number] Birth Date: 1926/09/22 Height:     62.5 in. Weight:     93.9 lbs. Gender:      Female Exam Date:  08/09/2017 Indications: Advanced Age, Caucasian, Height Loss, History of Fracture (Adult), Osteoporotic Fractures: Right humerus Treatments: ASSESSMENT: The BMD measured at Forearm Radius 33% is 0.495 g/cm2 with a T-score of -4.3. This patient is considered OSTEOPOROTIC according to World Health Organization [name]) criteria. Lumbar spine was not utilized due to scoliosis. Site Region Measured Measured WHO Young Adult BMD Date       Age      Classification T-score DualFemur Total Right 08/09/2017 89.2 Osteoporosis -3.2 0.601 g/cm2 Left Forearm Radius 33% 08/09/2017 89.2 Osteoporosis -4.3 0.495 g/cm2 World Health Organization [facility]) criteria for post-menopausal, Caucasian Women: Normal:       T-score at or above -1 SD Osteopenia:   T-score between -1 and -2.5 SD Osteoporosis: T-score at or below -2.5 SD RECOMMENDATIONS: 1. All patients should optimize calcium and vitamin D intake. 2. Consider FDA-approved medical therapies in postmenopausal women and men aged 82 years and older, based on the following: a. A hip or vertebral(clinical or morphometric) fracture b. T-score < -2.5 at the femoral neck or spine after appropriate evaluation to exclude secondary causes c. Low bone mass (T-score between -1.0 and -2.5 at the femoral neck or spine) and a 10-year probability of a hip fracture > 3% or a 10-year probability of a major  osteoporosis-related fracture > 20% based on the [place]-adapted WHO algorithm d. Clinician judgment and/or patient preferences may indicate treatment for people with 10-year fracture probabilities above or below these levels FOLLOW-UP: People with diagnosed cases of osteoporosis or at high risk for fracture should have regular bone mineral density tests. For patients eligible for Medicare, routine testing is allowed once every 2 years. The testing frequency can be increased to one year for patients who have rapidly progressing disease, those who are receiving or discontinuing medical therapy to restore bone mass, or have additional risk factors. I have reviewed this report, and agree with the above findings. Mark A. Tyron Russell, M.D. [facility] Radiology Electronically Signed   By: Ulyses Southward M.D.   On: 08/09/2017 15:17    Assessment & Plan:   Problem List Items Addressed This Visit    Underweight    The weight loss of 2 lbs is due to infrequent meals when not provided..  Advised to suppplement her  meals with daily ensure       Mild cognitive impairment with memory loss  She has been referred to Neurology and is awaiting evaluation. She remains competent to live alone        Hypertension    She does not require medication based on today's untreated reading.       History of fall within past 90 days    Lives independently.  Again advised to get Life alert.  Advised to consider transition to assisted living        A total of 25 minutes of face to face time was spent with patient more than half of which was spent in counselling about the above mentioned conditions  and coordination of care  I have discontinued Guadalupe DawnMarilyn R. Harshfield's Calcium Carbonate-Vit D-Min (CALCIUM 1200 PO), telmisartan, and furosemide. I am also having her start on calcium-vitamin D. We will continue to administer pneumococcal 23 valent vaccine.  Meds ordered this encounter  Medications  . calcium-vitamin D (OSCAL 500/200 D-3)  500-200 MG-UNIT tablet    Sig: Take 1 tablet by mouth 2 (two) times daily.    Dispense:  60 tablet    Refill:  11    Medications Discontinued During This Encounter  Medication Reason  . telmisartan (MICARDIS) 20 MG tablet   . furosemide (LASIX) 20 MG tablet   . Calcium Carbonate-Vit D-Min (CALCIUM 1200 PO)     Follow-up: Return in about 3 months (around 11/29/2017) for DEMENTIA,  WEIGHT LOSS.   Sherlene Shamseresa L Brayden Brodhead, MD

## 2017-08-29 NOTE — Patient Instructions (Addendum)
Your blood pressure is better.  You do not need to take the telmisartan  You should take 2 chewable calcium tablets daily  With meals   You are still losing weight!   I WANT YOU TO INCREASE  YOUR PROTEIN INTAKE   PLEASE DRINK AN ENSURE EVERY SINGLE DAY.  IN ADDITION TO THREE MEALS DAILY   EAT MORE PEANUT BUTTER AND CRACKERS  Daily too   YOU SHOULD WEAR YOUR COMPRESSION STOCKINGS DAILY  To manage the fluid in your ankles  You STILL NEED THE LIFE ALERT IF YOU ARE NOT GOING TO MOVE TO [place]

## 2017-08-30 ENCOUNTER — Encounter: Payer: Self-pay | Admitting: Internal Medicine

## 2017-08-30 NOTE — Assessment & Plan Note (Signed)
The weight loss of 2 lbs is due to infrequent meals when not provided..  Advised to suppplement her  meals with daily ensure

## 2017-08-30 NOTE — Assessment & Plan Note (Signed)
Lives independently.  Again advised to get Life alert.  Advised to consider transition to assisted living

## 2017-08-30 NOTE — Assessment & Plan Note (Signed)
She does not require medication based on today's untreated reading.

## 2017-08-30 NOTE — Assessment & Plan Note (Signed)
She has been referred to Neurology and is awaiting evaluation. She remains competent to live alone

## 2017-09-26 ENCOUNTER — Other Ambulatory Visit: Payer: Self-pay | Admitting: Neurology

## 2017-09-26 DIAGNOSIS — E538 Deficiency of other specified B group vitamins: Secondary | ICD-10-CM | POA: Diagnosis not present

## 2017-09-26 DIAGNOSIS — R413 Other amnesia: Secondary | ICD-10-CM | POA: Diagnosis not present

## 2017-09-26 DIAGNOSIS — E559 Vitamin D deficiency, unspecified: Secondary | ICD-10-CM | POA: Diagnosis not present

## 2017-10-04 ENCOUNTER — Ambulatory Visit
Admission: RE | Admit: 2017-10-04 | Discharge: 2017-10-04 | Disposition: A | Discharge status: Home / Self Care | Payer: PPO | Source: Ambulatory Visit | Attending: Neurology | Admitting: Neurology

## 2017-10-04 DIAGNOSIS — E538 Deficiency of other specified B group vitamins: Secondary | ICD-10-CM | POA: Diagnosis not present

## 2017-10-04 DIAGNOSIS — G319 Degenerative disease of nervous system, unspecified: Secondary | ICD-10-CM | POA: Diagnosis not present

## 2017-10-04 DIAGNOSIS — I6782 Cerebral ischemia: Secondary | ICD-10-CM | POA: Diagnosis not present

## 2017-10-04 DIAGNOSIS — R413 Other amnesia: Secondary | ICD-10-CM | POA: Diagnosis not present

## 2017-12-05 ENCOUNTER — Encounter: Payer: Self-pay | Admitting: Internal Medicine

## 2017-12-05 ENCOUNTER — Ambulatory Visit (INDEPENDENT_AMBULATORY_CARE_PROVIDER_SITE_OTHER): Payer: PPO | Admitting: Internal Medicine

## 2017-12-05 VITALS — BP 128/82 | HR 78 | Temp 98.1°F | Resp 16 | Ht 63.0 in | Wt 95.2 lb

## 2017-12-05 DIAGNOSIS — F0150 Vascular dementia without behavioral disturbance: Secondary | ICD-10-CM | POA: Diagnosis not present

## 2017-12-05 DIAGNOSIS — Z23 Encounter for immunization: Secondary | ICD-10-CM

## 2017-12-05 NOTE — Patient Instructions (Addendum)
   I recommend trying  Aricept , THE MEDICINE THAT [facility] RECOMMENDED.  YOU CAN CALL IT  Your "brain vitamin"  Take it After dinner every night.. It may help your memory     I agree that [place] [facility] [place] a great idea and will make your life EASIER AND MORE FUN!   We can discuss END OF Life goals of care ISSUES AT YOUR NEXT VISIT

## 2017-12-05 NOTE — Progress Notes (Signed)
Subjective:  Patient ID: Courtney Newton, female    DOB: 1927-03-20  Age: 84 y.o. MRN: [id number]  CC: The primary encounter diagnosis was Need for influenza vaccination. A diagnosis of Dementia, vascular, mixed, without behavioral disturbance was also pertinent to this visit.  HPI Courtney Newton presents for follow up on MCI , hypertension , recurrent falls,  And underweight.  acconpanied by community pastor   Referred to Dr Sherryll Burger after last visit.   Entire serologic workup repeated and normal.   MRI reviewed with patient and pastor.  Chronic small vessel ischemic changes. aricept started at 5 mg  qhs  July 8  BUT apatient has not been taking it.   ON the day of MRI July 25 she showed up at [facility] clinic confused. Renato Gails working to get Medical and Healthcare POA  Gordan Payment helping   Became confused about her date of visit (came here on Friday)   Weight loss 1.5 lbs   Tried  but refused meals on wheels .  Cooking for herself,  No access issues per pastor .   Has become compulsive about things in order to allow/compensate  for memory impairment s  In the process of Applying to [name] independent living (she refuses to go otherwise) . Renato Gails feels she can live independently and she certainly has the financial health to do it.  CURRENTLY LIVING AT [facility] .  Her  ONLY SOCIAL INTERACTION IS Saturday  BREAKFAST    Outpatient Medications Prior to Visit  Medication Sig Dispense Refill  . calcium-vitamin D (OSCAL 500/200 D-3) 500-200 MG-UNIT tablet Take 1 tablet by mouth 2 (two) times daily. (Patient not taking: Reported on 12/05/2017) 60 tablet 11   Facility-Administered Medications Prior to Visit  Medication Dose Route Frequency Provider Last Rate Last Dose  . pneumococcal 23 valent vaccine (PNU-IMMUNE) injection 0.5 mL  0.5 mL Intramuscular Once Sherlene Shams, MD        Review of Systems;  Patient denies headache, fevers, malaise, unintentional weight loss, skin rash, eye  pain, sinus congestion and sinus pain, sore throat, dysphagia,  hemoptysis , cough, dyspnea, wheezing, chest pain, palpitations, orthopnea, edema, abdominal pain, nausea, melena, diarrhea, constipation, flank pain, dysuria, hematuria, urinary  Frequency, nocturia, numbness, tingling, seizures,  Focal weakness, Loss of consciousness,  Tremor, insomnia, depression, anxiety, and suicidal ideation.      Objective:  BP 128/82 (BP Location: Left Arm, Patient Position: Sitting, Cuff Size: Normal)   Pulse 78   Temp 98.1 F (36.7 C) (Oral)   Resp 16   Ht 5\' 3"  (1.6 m)   Wt 95 lb 3.2 oz (43.2 kg)   SpO2 97%   BMI 16.86 kg/m   BP Readings from Last 3 Encounters:  12/05/17 128/82  08/29/17 138/82  08/29/17 138/82    Wt Readings from Last 3 Encounters:  12/05/17 95 lb 3.2 oz (43.2 kg)  08/29/17 97 lb (44 kg)  08/29/17 97 lb (44 kg)    General appearance: alert, cooperative and appears stated age Ears: normal TM's and external ear canals both ears Throat: lips, mucosa, and tongue normal; teeth and gums normal Neck: no adenopathy, no carotid bruit, supple, symmetrical, trachea midline and thyroid not enlarged, symmetric, no tenderness/mass/nodules Back: symmetric, no curvature. ROM normal. No CVA tenderness. Lungs: clear to auscultation bilaterally Heart: regular rate and rhythm, S1, S2 normal, no murmur, click, rub or gallop Abdomen: soft, non-tender; bowel sounds normal; no masses,  no organomegaly Pulses: 2+  and symmetric Skin: Skin color, texture, turgor normal. No rashes or lesions Lymph nodes: Cervical, supraclavicular, and axillary nodes normal.  No results found for: HGBA1C  Lab Results  Component Value Date   CREATININE 0.84 05/30/2017   CREATININE 1.04 12/02/2016   CREATININE 1.02 11/28/2015    Lab Results  Component Value Date   WBC 7.1 12/02/2016   HGB 14.8 12/02/2016   HCT 45.9 12/02/2016   PLT 190.0 12/02/2016   GLUCOSE 100 (H) 05/30/2017   CHOL 151 07/08/2015     TRIG 67 07/08/2015   HDL 43 07/08/2015   LDLDIRECT 137.8 04/06/2012   LDLCALC 95 07/08/2015   ALT 12 05/30/2017   AST 23 05/30/2017   NA 142 05/30/2017   K 4.0 05/30/2017   CL 102 05/30/2017   CREATININE 0.84 05/30/2017   BUN 24 (H) 05/30/2017   CO2 33 (H) 05/30/2017   TSH 4.27 12/02/2016    Mr Brain Wo Contrast  Result Date: 10/04/2017 CLINICAL DATA:  Progressive memory loss since February of this year. EXAM: MRI HEAD WITHOUT CONTRAST TECHNIQUE: Multiplanar, multiecho pulse sequences of the brain and surrounding structures were obtained without intravenous contrast. COMPARISON:  None. FINDINGS: Brain: Diffusion imaging does not show any acute or subacute infarction. Mild chronic small-vessel change of the pons. No focal cerebellar insult. Cerebral hemispheres show age related atrophy with mild to moderate chronic small-vessel ischemic changes of the white matter. Atrophy is slightly temporal lobe predominant. No cortical or large vessel territory infarction. No mass lesion, hemorrhage, hydrocephalus or extra-axial collection. Vascular: Major vessels at the base of the brain show flow. Skull and upper cervical spine: Negative Sinuses/Orbits: Clear/normal Other: None IMPRESSION: No acute or reversible finding. Brain atrophy, somewhat temporal lobe predominant. Mild to moderate chronic small-vessel ischemic changes of the cerebral hemispheric white matter. Electronically Signed   By: Paulina FusiMark  Shogry M.D.   On: 10/04/2017 12:50    Assessment & Plan:   Problem List Items Addressed This Visit    Dementia, vascular, mixed, without behavioral disturbance    MRI reviewed.  Patient encouraged to try Aricept prescribed by Dr. Sherryll BurgerShah . She continues to live alone and cook for herself.  Renato Gailsastor is managing her pillbox,   Education officer, environmentalCommunity pastor is working to [employer] healthcare and financial POA and arrange for [place] [facility]. I concur with this move.  For the time being she is competent  enough to live alone with minimal supervision .        Other Visit Diagnoses    Need for influenza vaccination    -  Primary   Relevant Orders   Flu vaccine HIGH DOSE PF (Fluzone High dose) (Completed)     A total of 25 minutes was spent with patient more than half of which was spent in counseling patient on the above mentioned issues , reviewing and explaining recent labs and imaging studies done, initiating an  end of life advanced directives.  and coordination of care.  I have discontinued Guadalupe DawnMarilyn R. Rounsaville's calcium-vitamin D. We will continue to administer pneumococcal 23 valent vaccine.  No orders of the defined types were placed in this encounter.   Medications Discontinued During This Encounter  Medication Reason  . calcium-vitamin D (OSCAL 500/200 D-3) 500-200 MG-UNIT tablet Patient has not taken in last 30 days    Follow-up: No follow-ups on file.   Sherlene Shamseresa L Tullo, MD

## 2017-12-05 NOTE — Assessment & Plan Note (Addendum)
MRI reviewed.  Patient encouraged to try Aricept prescribed by Dr. Sherryll BurgerShah . She continues to live alone and cook for herself.  Renato Gailsastor is managing her pillbox,   Education officer, environmentalCommunity pastor is working to [employer] healthcare and financial POA and arrange for [place] [facility]. I concur with this move.  For the time being she is competent enough to live alone with minimal supervision .

## 2018-01-31 ENCOUNTER — Ambulatory Visit (INDEPENDENT_AMBULATORY_CARE_PROVIDER_SITE_OTHER): Payer: PPO

## 2018-01-31 DIAGNOSIS — M8000XA Age-related osteoporosis with current pathological fracture, unspecified site, initial encounter for fracture: Secondary | ICD-10-CM

## 2018-01-31 MED ORDER — DENOSUMAB 60 MG/ML ~~LOC~~ SOSY
60.0000 mg | PREFILLED_SYRINGE | Freq: Once | SUBCUTANEOUS | Status: AC
  Administered 2018-01-31: 60 mg via SUBCUTANEOUS

## 2018-01-31 NOTE — Progress Notes (Signed)
Patient comes in for Prolia injection. Prolia administered subcutaneously into right arm. Patient tolerated injection well.   Reviewed.  Dr Lorin PicketScott

## 2018-02-01 ENCOUNTER — Telehealth: Payer: Self-pay

## 2018-02-01 NOTE — Telephone Encounter (Signed)
You didn't see these in the exam room you were using yesterday did you?

## 2018-02-01 NOTE — Telephone Encounter (Signed)
Copied from CRM [id number]. Topic: General - Other >> Feb 01, 2018 11:27 AM Fanny BienIlderton, Jessica L wrote: Reason for CRM: pt called and stated that she may have lost some lose keys at the office. Please call if any are found.

## 2018-02-02 NOTE — Telephone Encounter (Signed)
Advised patient on 02/01/18 keys were not found.

## 2018-02-03 ENCOUNTER — Ambulatory Visit: Payer: Self-pay | Admitting: *Deleted

## 2018-02-03 NOTE — Telephone Encounter (Signed)
Gordan PaymentKaren Lewis, friend of pt listed on DPR calling to voice concern of pt having hallucinations of 2 college aged men taking residence in her home. Clydie BraunKaren states that for less than a month the pt has mentioned on multiple occasions that these men are coming to her home during the night and stay upstairs, but the pt stays alone in a one level home. Clydie BraunKaren states that the pt does not seem afraid but does seem resentful about the men staying in her home. Clydie BraunKaren states that the pt does have a history of dementia and she appears to be getting worse about losing things. Clydie BraunKaren states that when she was with the pt earlier this week for her Prolia injections she seemed like herself. Clydie BraunKaren states that when she offered to take the pt out to diner last night the pt stated that she could not go because of the men in her home. Clydie BraunKaren states she does not think that the pt would be receptive to going to the emergency room and she is unable to bring the pt in for an appt today due to having a recent death in the family. Pt scheduled for appt on Monday, 11/18 at 10am. Clydie BraunKaren would like a call back regarding recommendations on what to do for the pt. Clydie BraunKaren can be contacted at [phone]. Reason for Disposition . [1] Longstanding confusion (e.g., dementia, stroke) AND [2] worsening  Answer Assessment - Initial Assessment Questions 1. LEVEL OF CONSCIOUSNESS: "How is he (she, the patient) acting right now?" (e.g., alert-oriented, confused, lethargic, stuporous, comatose)     Alert and oriented but is having hallucinations of 2 college aged men staying in her house. Clydie BraunKaren states that the pt has mentioned approximately 3 times in less than a month. Clydie BraunKaren states that the pt states that the men come at night and stay upstairs, but the pt has a one level home. 2. ONSET: "When did the confusion start?"  (minutes, hours, days)     Less than a month ago 3. PATTERN "Does this come and go, or has it been constant since it started?"  "Is it  present now?"     Present now, Clydie BraunKaren states that yesterday the pt stated she could not go out to eat with her because of the college aged men in her house 4. ALCOHOL or DRUGS: "Has he been drinking alcohol or taking any drugs?"      Not assessed 5. NARCOTIC MEDICATIONS: "Has he been receiving any narcotic medications?" (e.g., morphine, Vicodin)     No assessed 6. CAUSE: "What do you think is causing the confusion?"      Pt has a history of dementia 7. OTHER SYMPTOMS: "Are there any other symptoms?" (e.g., difficulty breathing, headache, fever, weakness)     Clydie BraunKaren not aware of any symptoms at this time. Clydie BraunKaren states when she was with the pt earlier this week to receive Prolia injection the pt seemed like herself and did not voice that she was having pain or other symptoms at this time.  Protocols used: CONFUSION [facility]- DELIRIUM-A-AH

## 2018-02-03 NOTE — Telephone Encounter (Signed)
I agree with need for evaluation.  It appears she has been diagnosed with dementia.  Do not see mention of hallucinations in previous history, but hallucinations can be c/w dementia.  Agree with need for evaluation.  If anything acute or change, then would recommend evaluation earlier.

## 2018-02-03 NOTE — Telephone Encounter (Signed)
Pt's friend is unable to bring pt to office today for an appt and she doesn't think that the pt will go to the ED, so they scheduled an appt for Monday with Dr. Darrick Huntsmanullo. In the mean time the friend is wanting to know if there is anything else that they can do.

## 2018-02-03 NOTE — Telephone Encounter (Signed)
Please advise 

## 2018-02-03 NOTE — Telephone Encounter (Signed)
Spoke with pt's friend and informed her that Dr. Lorin PicketScott does agree with the pt being evaluated and is okay with her seeing Dr. Darrick Huntsmanullo on Monday but if they see anything acute or changes then they would need to get her the ED over the weekend. Friend wanted to know if she should agree with the pt when she started talking about the things she sees. I spoke with Olegario MessierKathy, RN and we advised Clydie BraunKaren not to agree but not to agrue with the pt about it. We told her that she should still let the pt know that there is no one in her home, that she doesn't hear anything and that she doesn't live in a two story home. Clydie BraunKaren gave a verbal understanding.

## 2018-02-06 ENCOUNTER — Ambulatory Visit (INDEPENDENT_AMBULATORY_CARE_PROVIDER_SITE_OTHER): Payer: PPO | Admitting: Internal Medicine

## 2018-02-06 ENCOUNTER — Encounter: Payer: Self-pay | Admitting: Internal Medicine

## 2018-02-06 VITALS — BP 130/76 | HR 74 | Temp 97.6°F | Resp 15 | Ht 63.0 in | Wt 95.4 lb

## 2018-02-06 DIAGNOSIS — F0150 Vascular dementia without behavioral disturbance: Secondary | ICD-10-CM

## 2018-02-06 DIAGNOSIS — R441 Visual hallucinations: Secondary | ICD-10-CM

## 2018-02-06 DIAGNOSIS — R4182 Altered mental status, unspecified: Secondary | ICD-10-CM | POA: Diagnosis not present

## 2018-02-06 LAB — POCT URINALYSIS DIPSTICK
Bilirubin, UA: NEGATIVE
Glucose, UA: NEGATIVE
KETONES UA: NEGATIVE
Leukocytes, UA: NEGATIVE
NITRITE UA: NEGATIVE
PH UA: 7.5 (ref 5.0–8.0)
PROTEIN UA: NEGATIVE
SPEC GRAV UA: 1.02 (ref 1.010–1.025)
UROBILINOGEN UA: 0.2 U/dL

## 2018-02-06 LAB — COMPREHENSIVE METABOLIC PANEL
ALT: 17 U/L (ref 0–35)
AST: 29 U/L (ref 0–37)
Albumin: 4.3 g/dL (ref 3.5–5.2)
Alkaline Phosphatase: 36 U/L — ABNORMAL LOW (ref 39–117)
BILIRUBIN TOTAL: 0.5 mg/dL (ref 0.2–1.2)
BUN: 23 mg/dL (ref 6–23)
CALCIUM: 9.2 mg/dL (ref 8.4–10.5)
CO2: 29 meq/L (ref 19–32)
CREATININE: 0.89 mg/dL (ref 0.40–1.20)
Chloride: 104 mEq/L (ref 96–112)
GFR: 63.37 mL/min (ref >60.00)
Glucose, Bld: 88 mg/dL (ref 70–99)
Potassium: 4.2 mEq/L (ref 3.5–5.1)
Sodium: 140 mEq/L (ref 135–145)
TOTAL PROTEIN: 6.8 g/dL (ref 6.0–8.3)

## 2018-02-06 LAB — CBC WITH DIFFERENTIAL/PLATELET
BASOS ABS: 0.1 10*3/uL (ref 0.0–0.1)
Basophils Relative: 0.6 % (ref 0.0–3.0)
EOS ABS: 0.1 10*3/uL (ref 0.0–0.7)
Eosinophils Relative: 1.2 % (ref 0.0–5.0)
HCT: 41.9 % (ref 36.0–46.0)
Hemoglobin: 14.1 g/dL (ref 12.0–15.0)
LYMPHS ABS: 3.1 10*3/uL (ref 0.7–4.0)
LYMPHS PCT: 39.2 % (ref 12.0–46.0)
MCHC: 33.5 g/dL (ref 30.0–36.0)
MCV: 94.3 fl (ref 78.0–100.0)
Monocytes Absolute: 0.8 10*3/uL (ref 0.1–1.0)
Monocytes Relative: 9.9 % (ref 3.0–12.0)
NEUTROS ABS: 3.8 10*3/uL (ref 1.4–7.7)
NEUTROS PCT: 49.1 % (ref 43.0–77.0)
PLATELETS: 190 10*3/uL (ref 150.0–400.0)
RBC: 4.44 Mil/uL (ref 3.87–5.11)
RDW: 13.5 % (ref 11.5–15.5)
WBC: 7.8 10*3/uL (ref 4.0–10.5)

## 2018-02-06 LAB — VITAMIN B12: Vitamin B-12: 474 pg/mL (ref 211–911)

## 2018-02-06 LAB — URINALYSIS, MICROSCOPIC ONLY
RBC / HPF: NONE SEEN (ref 0–?)
WBC, UA: NONE SEEN (ref 0–?)

## 2018-02-06 LAB — TSH: TSH: 5.95 u[IU]/mL — AB (ref 0.35–4.50)

## 2018-02-06 NOTE — Progress Notes (Signed)
Subjective:  Patient ID: Courtney Newton, female    DOB: 05-Mar-1927  Age: 84 y.o. MRN: [id number]  CC: The primary encounter diagnosis was Acute alteration in mental status. Diagnoses of Episodes of formed visual hallucinations and Dementia, vascular, mixed, without behavioral disturbance (HCC) were also pertinent to this visit.  HPI Courtney Newton presents for follow up on dementia  Brought in by neighbor due to patient reports visual disturbances /exhibiting signs of visual hallucinations  Seeing 2 men who " live above her"  On the second story of her private  home.  They do not talk to her or interact with her.  She doesn't like seeing them, but denies feeling  threatened by them, and the sightings are not daily and have not occurred in a few weeks .  She lives in a 2 story home with no basement.   She states that she  saw her older sister recently  (who is 50 yrs old and still alive , as far as we know  ) But not sure where he saw her or where her sister was  Since she resides in [place]. She is adamant about seeing her and that she is still alive.  Her neighbor who is present cannot confirm the visit by the sister. Patient has a brother, and  acknowledges that her brother passed  Away  5 years ago and liived [place] n [place].    Sleeping well    Denies confusion .  No reports of leaving the home at night.  Still driving her own car.  No reports of traffic accidents or getting lost     No outpatient medications prior to visit.   Facility-Administered Medications Prior to Visit  Medication Dose Route Frequency Provider Last Rate Last Dose  . pneumococcal 23 valent vaccine (PNU-IMMUNE) injection 0.5 mL  0.5 mL Intramuscular Once Sherlene Shams, MD        Review of Systems;  Patient denies headache, fevers, malaise, unintentional weight loss, skin rash, eye pain, sinus congestion and sinus pain, sore throat, dysphagia,  hemoptysis , cough, dyspnea, wheezing, chest pain, palpitations, orthopnea,  edema, abdominal pain, nausea, melena, diarrhea, constipation, flank pain, dysuria, hematuria, urinary  Frequency, nocturia, numbness, tingling, seizures,  Focal weakness, Loss of consciousness,  Tremor, insomnia, depression, anxiety, and suicidal ideation.      Objective:  BP 130/76 (BP Location: Left Arm, Patient Position: Sitting, Cuff Size: Normal)   Pulse 74   Temp 97.6 F (36.4 C) (Oral)   Resp 15   Ht 5\' 3"  (1.6 m)   Wt 95 lb 6.4 oz (43.3 kg)   SpO2 97%   BMI 16.90 kg/m   BP Readings from Last 3 Encounters:  02/06/18 130/76  12/05/17 128/82  08/29/17 138/82    Wt Readings from Last 3 Encounters:  02/06/18 95 lb 6.4 oz (43.3 kg)  12/05/17 95 lb 3.2 oz (43.2 kg)  08/29/17 97 lb (44 kg)    General appearance: alert, cooperative and appears stated age Ears: normal TM's and external ear canals both ears Throat: lips, mucosa, and tongue normal; teeth and gums normal Neck: no adenopathy, no carotid bruit, supple, symmetrical, trachea midline and thyroid not enlarged, symmetric, no tenderness/mass/nodules Back: symmetric, no curvature. ROM normal. No CVA tenderness. Lungs: clear to auscultation bilaterally Heart: regular rate and rhythm, S1, S2 normal, no murmur, click, rub or gallop Abdomen: soft, non-tender; bowel sounds normal; no masses,  no organomegaly Pulses: 2+ and symmetric Skin: Skin color, texture,  turgor normal. No rashes or lesions Lymph nodes: Cervical, supraclavicular, and axillary nodes normal.  No results found for: HGBA1C  Lab Results  Component Value Date   CREATININE 0.89 02/06/2018   CREATININE 0.84 05/30/2017   CREATININE 1.04 12/02/2016    Lab Results  Component Value Date   WBC 7.8 02/06/2018   HGB 14.1 02/06/2018   HCT 41.9 02/06/2018   PLT 190.0 02/06/2018   GLUCOSE 88 02/06/2018   CHOL 151 07/08/2015   TRIG 67 07/08/2015   HDL 43 07/08/2015   LDLDIRECT 137.8 04/06/2012   LDLCALC 95 07/08/2015   ALT 17 02/06/2018   AST 29  02/06/2018   NA 140 02/06/2018   K 4.2 02/06/2018   CL 104 02/06/2018   CREATININE 0.89 02/06/2018   BUN 23 02/06/2018   CO2 29 02/06/2018   TSH 5.95 (H) 02/06/2018    Mr Brain Wo Contrast  Result Date: 10/04/2017 CLINICAL DATA:  Progressive memory loss since February of this year. EXAM: MRI HEAD WITHOUT CONTRAST TECHNIQUE: Multiplanar, multiecho pulse sequences of the brain and surrounding structures were obtained without intravenous contrast. COMPARISON:  None. FINDINGS: Brain: Diffusion imaging does not show any acute or subacute infarction. Mild chronic small-vessel change of the pons. No focal cerebellar insult. Cerebral hemispheres show age related atrophy with mild to moderate chronic small-vessel ischemic changes of the white matter. Atrophy is slightly temporal lobe predominant. No cortical or large vessel territory infarction. No mass lesion, hemorrhage, hydrocephalus or extra-axial collection. Vascular: Major vessels at the base of the brain show flow. Skull and upper cervical spine: Negative Sinuses/Orbits: Clear/normal Other: None IMPRESSION: No acute or reversible finding. Brain atrophy, somewhat temporal lobe predominant. Mild to moderate chronic small-vessel ischemic changes of the cerebral hemispheric white matter. Electronically Signed   By: Paulina FusiMark  Shogry M.D.   On: 10/04/2017 12:50    Assessment & Plan:   Problem List Items Addressed This Visit    Dementia, vascular, mixed, without behavioral disturbance (HCC)    Thus far she has had no behavioral changes but she has developed mild visual hallucinations that do not have an alternative etiology.  She is not frightened or agitated; no medications will be prescribed at this point given the mild nature of the hallucinations       Episodes of formed visual hallucinations    Likely due to her dementia,; there is no sign of metabolic, endocrine or infectious  etiologies  By today's labs.  (Tsh is only minimally elevated.)   Will  refrain from using anti psychotics as the sightings do not result in agitation or paranoia at this time.        Other Visit Diagnoses    Acute alteration in mental status    -  Primary   Relevant Orders   POCT urinalysis dipstick (Completed)   Urine Microscopic Only (Completed)   Urine Culture   TSH (Completed)   Vitamin B12 (Completed)   Comprehensive metabolic panel (Completed)   RBC Folate   CBC with Differential/Platelet (Completed)    A total of 25 minutes of face to face time was spent with patient more than half of which was spent in counselling about the above mentioned conditions  and coordination of care   We will continue to administer pneumococcal 23 valent vaccine.  No orders of the defined types were placed in this encounter.   There are no discontinued medications.  Follow-up: No follow-ups on file.   Sherlene Shamseresa L Tameka Hoiland, MD

## 2018-02-06 NOTE — Patient Instructions (Signed)
I'm glad you are feeling ok  I will call you with the results of your blood work and if we need to start any medications

## 2018-02-07 DIAGNOSIS — R441 Visual hallucinations: Secondary | ICD-10-CM | POA: Insufficient documentation

## 2018-02-07 LAB — URINE CULTURE
MICRO NUMBER:: 91385860
SPECIMEN QUALITY: ADEQUATE

## 2018-02-07 LAB — FOLATE RBC: RBC Folate: 608 ng/mL RBC (ref >280)

## 2018-02-07 NOTE — Assessment & Plan Note (Addendum)
Likely due to her dementia,; there is no sign of metabolic, endocrine or infectious  etiologies  By today's labs.  (Tsh is only minimally elevated.)   Will refrain from using anti psychotics as the sightings do not result in agitation or paranoia at this time.

## 2018-02-07 NOTE — Assessment & Plan Note (Signed)
Thus far she has had no behavioral changes but she has developed mild visual hallucinations that do not have an alternative etiology.  She is not frightened or agitated; no medications will be prescribed at this point given the mild nature of the hallucinations

## 2018-02-08 ENCOUNTER — Telehealth: Payer: Self-pay

## 2018-02-08 NOTE — Telephone Encounter (Signed)
Copied from CRM [id number]. Topic: General - Other >> Feb 08, 2018  9:25 AM Tamela OddiHarris, Brenda J wrote: Reason for CRM: Patient's guardian on DPR, called to get the status of the patient's lab work from Monday.  Please advise and call her back once the results are in.  CB# [phone]   **LVM for Gordan PaymentKaren Lewis to call back.**

## 2018-02-08 NOTE — Telephone Encounter (Signed)
I spoke with Gordan PaymentKaren Lewis who is also on patient's DPR. I gave her lab results & she stated that if patient's hallucinations worsen she will call back.

## 2018-02-27 DIAGNOSIS — E559 Vitamin D deficiency, unspecified: Secondary | ICD-10-CM | POA: Diagnosis not present

## 2018-02-27 DIAGNOSIS — R413 Other amnesia: Secondary | ICD-10-CM | POA: Diagnosis not present

## 2018-05-25 ENCOUNTER — Telehealth: Payer: Self-pay | Admitting: *Deleted

## 2018-05-25 NOTE — Telephone Encounter (Signed)
Copied from CRM [id number]. Topic: General - Call Back - No Documentation >> May 25, 2018 12:22 PM Courtney Newton wrote: Reason for CRM:   Pt called and states she missed a call from Dr. Melina Newton office about an hour ago.  Pt can be reached at [phone]

## 2018-05-25 NOTE — Telephone Encounter (Signed)
Attempted to call pt to let her know that I did not call her and that I don't see in the chart where anyone tried to call her from our office. If pt calls back PEC may speak with pt.

## 2018-06-16 ENCOUNTER — Telehealth: Payer: Self-pay | Admitting: Internal Medicine

## 2018-06-16 NOTE — Telephone Encounter (Signed)
Copied from CRM [id number]. Topic: Quick Communication - See Telephone Encounter >> Jun 16, 2018 11:27 AM Jens Som A wrote: CRM for notification. See Telephone encounter for: 06/16/18.  Rev. Army Chaco is now medical power of [employer] is requesting a copy of her wishes end of Life -Life decision form.  Please advise [phone]

## 2018-06-20 NOTE — Telephone Encounter (Signed)
Advanced Directive forms have been placed up front for Randy to come by and pick up so that him and the pt can fill them out.

## 2018-07-21 ENCOUNTER — Telehealth: Payer: Self-pay

## 2018-07-21 NOTE — Telephone Encounter (Signed)
Copied from CRM [id number]. Topic: General - Inquiry >> Jul 21, 2018  3:07 PM Reggie Pile, NT wrote: Reason for CRM: Patient's friend is calling in stating she would like to see if office is still doing prolea injections, due to patient coming up on needing another. Friend's call back is [phone]. However if appointment can be made call patient personally. Patient does have dementia and will need reminders, however she does still drive. Patients number is [phone].

## 2018-07-24 NOTE — Telephone Encounter (Signed)
Insurance verification for [employer] on [employer]. Patient friend notified patient is due 08/01/18 , after I receive approval will schedule,.

## 2018-07-27 NOTE — Telephone Encounter (Signed)
Patient has been scheduled for 08/02/18 at 11:30

## 2018-08-02 ENCOUNTER — Ambulatory Visit: Payer: PPO

## 2018-08-11 ENCOUNTER — Telehealth: Payer: Self-pay

## 2018-08-11 NOTE — Telephone Encounter (Signed)
Don't see where anyone has tried calling the pt.

## 2018-08-11 NOTE — Telephone Encounter (Signed)
Copied from CRM [id number]. Topic: General - Call Back - No Documentation >> Aug 11, 2018 11:58 AM Angela Nevin wrote: Reason for CRM: Patient returning call to office- she states she keeps getting calls. No documentation.

## 2018-09-07 ENCOUNTER — Ambulatory Visit (INDEPENDENT_AMBULATORY_CARE_PROVIDER_SITE_OTHER): Payer: PPO

## 2018-09-07 ENCOUNTER — Other Ambulatory Visit (INDEPENDENT_AMBULATORY_CARE_PROVIDER_SITE_OTHER): Payer: PPO

## 2018-09-07 ENCOUNTER — Other Ambulatory Visit: Payer: Self-pay

## 2018-09-07 ENCOUNTER — Ambulatory Visit (INDEPENDENT_AMBULATORY_CARE_PROVIDER_SITE_OTHER): Payer: PPO | Admitting: Internal Medicine

## 2018-09-07 DIAGNOSIS — R42 Dizziness and giddiness: Secondary | ICD-10-CM

## 2018-09-07 DIAGNOSIS — M8000XA Age-related osteoporosis with current pathological fracture, unspecified site, initial encounter for fracture: Secondary | ICD-10-CM

## 2018-09-07 DIAGNOSIS — R636 Underweight: Secondary | ICD-10-CM

## 2018-09-07 DIAGNOSIS — F0150 Vascular dementia without behavioral disturbance: Secondary | ICD-10-CM | POA: Diagnosis not present

## 2018-09-07 DIAGNOSIS — I10 Essential (primary) hypertension: Secondary | ICD-10-CM | POA: Diagnosis not present

## 2018-09-07 DIAGNOSIS — E559 Vitamin D deficiency, unspecified: Secondary | ICD-10-CM

## 2018-09-07 DIAGNOSIS — Z0000 Encounter for general adult medical examination without abnormal findings: Secondary | ICD-10-CM | POA: Diagnosis not present

## 2018-09-07 LAB — COMPREHENSIVE METABOLIC PANEL
ALT: 11 U/L (ref 0–35)
AST: 21 U/L (ref 0–37)
Albumin: 4.2 g/dL (ref 3.5–5.2)
Alkaline Phosphatase: 37 U/L — ABNORMAL LOW (ref 39–117)
BUN: 20 mg/dL (ref 6–23)
CO2: 32 mEq/L (ref 19–32)
Calcium: 9.4 mg/dL (ref 8.4–10.5)
Chloride: 103 mEq/L (ref 96–112)
Creatinine, Ser: 0.79 mg/dL (ref 0.40–1.20)
GFR: 68.32 mL/min (ref >60.00)
Glucose, Bld: 86 mg/dL (ref 70–99)
Potassium: 4.3 mEq/L (ref 3.5–5.1)
Sodium: 141 mEq/L (ref 135–145)
Total Bilirubin: 0.6 mg/dL (ref 0.2–1.2)
Total Protein: 6.1 g/dL (ref 6.0–8.3)

## 2018-09-07 LAB — CBC WITH DIFFERENTIAL/PLATELET
Basophils Absolute: 0.1 10*3/uL (ref 0.0–0.1)
Basophils Relative: 0.8 % (ref 0.0–3.0)
Eosinophils Absolute: 0.1 10*3/uL (ref 0.0–0.7)
Eosinophils Relative: 1.2 % (ref 0.0–5.0)
HCT: 39.7 % (ref 36.0–46.0)
Hemoglobin: 13.3 g/dL (ref 12.0–15.0)
Lymphocytes Relative: 34.5 % (ref 12.0–46.0)
Lymphs Abs: 2.5 10*3/uL (ref 0.7–4.0)
MCHC: 33.5 g/dL (ref 30.0–36.0)
MCV: 94.5 fl (ref 78.0–100.0)
Monocytes Absolute: 0.7 10*3/uL (ref 0.1–1.0)
Monocytes Relative: 9 % (ref 3.0–12.0)
Neutro Abs: 4 10*3/uL (ref 1.4–7.7)
Neutrophils Relative %: 54.5 % (ref 43.0–77.0)
Platelets: 201 10*3/uL (ref 150.0–400.0)
RBC: 4.2 Mil/uL (ref 3.87–5.11)
RDW: 13.5 % (ref 11.5–15.5)
WBC: 7.3 10*3/uL (ref 4.0–10.5)

## 2018-09-07 LAB — VITAMIN D 25 HYDROXY (VIT D DEFICIENCY, FRACTURES): VITD: 33.5 ng/mL (ref 30.00–100.00)

## 2018-09-07 LAB — TSH: TSH: 3.96 u[IU]/mL (ref 0.35–4.50)

## 2018-09-07 MED ORDER — DENOSUMAB 60 MG/ML ~~LOC~~ SOSY
60.0000 mg | PREFILLED_SYRINGE | Freq: Once | SUBCUTANEOUS | Status: AC
  Administered 2018-09-07: 60 mg via SUBCUTANEOUS

## 2018-09-07 NOTE — Patient Instructions (Addendum)
  Courtney Newton , Thank you for taking time to come for your Medicare Wellness Visit. I appreciate your ongoing commitment to your health goals. Please review the following plan we discussed and let me know if I can assist you in the future.   These are the goals we discussed: Goals    . Follow up with Primary Care Provider     MPOA to assist in acquiring life alert/medic alert system Increase fluid intake, stay hydrated       This is a list of the screening recommended for you and due dates:  Health Maintenance  Topic Date Due  . Flu Shot  10/21/2018  . Tetanus Vaccine  07/20/2022  . DEXA scan (bone density measurement)  Completed  . Pneumonia vaccines  Completed

## 2018-09-07 NOTE — Progress Notes (Signed)
Telephone Note  This visit type was conducted due to national recommendations for restrictions regarding the COVID-19 pandemic (e.g. social distancing).  This format is felt to be most appropriate for this patient at this time.  All issues noted in this document were discussed and addressed.  No physical exam was performed (except for noted visual exam findings with Video Visits).   I connected with@ on 09/07/18 at 10:30 AM EDT by telephone and verified that I am speaking with the correct person using two identifiers. Location patient: home Location provider: work or home office Persons participating in the virtual visit: patient, provider and POA and pastor Army ChacoRandy Orwig  I discussed the limitations, risks, security and privacy concerns of performing an evaluation and management service by telephone and the availability of in person appointments. I also discussed with the patient that there may be a patient responsible charge related to this service. The patient expressed understanding and agreed to proceed.  Reason for visit: follow up   HPI:   Doing very well.  Harvie HeckRandy is now her medical and general POA.  The church is providing meals and emotional support and providing worship on line.   She feels generally well,  Has a good appetite and is in good spirits. Harvie HeckRandy reports that she had One episode of feeling dizzy pre lunch, ,  Improved with hydration   She has had No falls.    ROS: See pertinent positives and negatives per HPI.  Past Medical History:  Diagnosis Date  . History of lumpectomy of both breasts   . Osteoporosis   . Vitamin D deficiency     Past Surgical History:  Procedure Laterality Date  . ABDOMINAL HYSTERECTOMY  1950  . APPENDECTOMY    . BILATERAL OOPHORECTOMY  1950    Family History  Problem Relation Age of Onset  . Heart disease Mother   . Heart disease Father     SOCIAL HX:  reports that she has never smoked. She has never used smokeless tobacco. She  reports current alcohol use. She reports that she does not use drugs.  No current outpatient medications on file.  Current Facility-Administered Medications:  .  pneumococcal 23 valent vaccine (PNU-IMMUNE) injection 0.5 mL, 0.5 mL, Intramuscular, Once, Sherlene Shamsullo, Teresa L, MD  EXAM:   General impression: alert, cooperative and articulate.  No signs of being in distress  Lungs: speech is fluent sentence length suggests that patient is not short of breath and not punctuated by cough, sneezing or sniffing. Marland Kitchen.   Psych: affect normal and jovial .  speech is articulate and non pressured .  Denies suicidal thoughts    ASSESSMENT AND PLAN:  Discussed the following assessment and plan:  Dementia, vascular, mixed, without behavioral disturbance Thus far she has had no behavioral changes and is no longer reporting  visual hallucinations   Osteoporosis With history of humeral fracture which occurred in 2019  .  She is receiving Prolia.  Reviewed calcium and vitamin D needs  Underweight She has maintained a stable weight since Randy's church has stopped in to provide nurtitional meals for her  On a regular basis.  Curlene Labrumdvised Randy to Continue to  suppplement her  meals with daily ensure     I discussed the assessment and treatment plan with the patient. The patient was provided an opportunity to ask questions and all were answered. The patient agreed with the plan and demonstrated an understanding of the instructions.   The patient was advised to call back  or seek an in-person evaluation if the symptoms worsen or if the condition fails to improve as anticipated.  I provided 25 minutes of non-face-to-face time during this encounter.   Crecencio Mc, MD

## 2018-09-07 NOTE — Progress Notes (Signed)
Patient presented today for Prolia injection.  Administered subcutaneously in left arm.  Patient tolerated well with no signs of distress.

## 2018-09-07 NOTE — Progress Notes (Signed)
Subjective:   Courtney MinksMarilyn R Newton is a 85 y.o. female who presents for Medicare Annual (Subsequent) preventive examination.  Review of Systems:  No ROS.  Medicare Wellness Virtual Visit.  Visual/audio telehealth visit, UTA vital signs.   See social history for additional risk factors.   Cardiac Risk Factors include: advanced age (>5255men, 45>65 women);hypertension     Objective:     Vitals: There were no vitals taken for this visit.  There is no height or weight on file to calculate BMI.  Advanced Directives 09/07/2018 08/29/2017 08/24/2016 08/25/2015 07/06/2015 07/06/2015  Does Patient Have a Medical Advance Directive? Yes No No Yes No No  Type of Advance Directive Healthcare Power of Attorney - - Living will;Healthcare Power of Attorney - -  Does patient want to make changes to medical advance directive? No - Patient declined - - - - -  Copy of MidwifeHealthcare Power of Attorney in Chart? - - - No - copy requested - -  Would patient like information on creating a medical advance directive? - No - Patient declined Yes (MAU/Ambulatory/Procedural Areas - Information given) - No - patient declined information No - patient declined information    Tobacco Social History   Tobacco Use  Smoking Status Never Smoker  Smokeless Tobacco Never Used     Counseling given: Not Answered   Clinical Intake:  Pre-visit preparation completed: Yes        Diabetes: No  How often do you need to have someone help you when you read instructions, pamphlets, or other written materials from your doctor or pharmacy?: 3 - Sometimes        Past Medical History:  Diagnosis Date  . History of lumpectomy of both breasts   . Osteoporosis   . Vitamin D deficiency    Past Surgical History:  Procedure Laterality Date  . ABDOMINAL HYSTERECTOMY  1950  . APPENDECTOMY    . BILATERAL OOPHORECTOMY  1950   Family History  Problem Relation Age of Onset  . Heart disease Mother   . Heart disease Father    Social  History   Socioeconomic History  . Marital status: Widowed    Spouse name: Not on file  . Number of children: Not on file  . Years of education: Not on file  . Highest education level: Not on file  Occupational History  . Not on file  Social Needs  . Financial resource strain: Not hard at all  . Food insecurity    Worry: Never true    Inability: Never true  . Transportation needs    Medical: No    Non-medical: No  Tobacco Use  . Smoking status: Never Smoker  . Smokeless tobacco: Never Used  Substance and Sexual Activity  . Alcohol use: Yes    Comment: 4oz with dinner  . Drug use: No  . Sexual activity: Never  Lifestyle  . Physical activity    Days per week: Not on file    Minutes per session: Not on file  . Stress: Not on file  Relationships  . Social Musicianconnections    Talks on phone: Not on file    Gets together: Not on file    Attends religious service: Not on file    Active member of club or organization: Not on file    Attends meetings of clubs or organizations: Not on file    Relationship status: Not on file  Other Topics Concern  . Not on file  Social History Narrative  .  Not on file    No outpatient encounter medications on file as of 09/07/2018.   Facility-Administered Encounter Medications as of 09/07/2018  Medication  . pneumococcal 23 valent vaccine (PNU-IMMUNE) injection 0.5 mL    Activities of Daily Living In your present state of health, do you have any difficulty performing the following activities: 09/07/2018  Hearing? Y  Vision? N  Difficulty concentrating or making decisions? (No Data)  Comment Dementia, vascular, mixed  Walking or climbing stairs? N  Dressing or bathing? N  Doing errands, shopping? Y  Comment Limited driving alone  Preparing Food and eating ? N  Using the Toilet? N  In the past six months, have you accidently leaked urine? N  Do you have problems with loss of bowel control? N  Managing your Medications? N  Managing your  Finances? Y  Comment Family friend/MPOA manages  Housekeeping or managing your Housekeeping? N  Some recent data might be hidden    Patient Care Team: Sherlene Shamsullo, Teresa L, MD as PCP - General (Internal Medicine)    Assessment:   This is a routine wellness examination for Courtney Newton.  I connected with patient and her Medical Power of Harlin Rainttorney Randy O. 09/08/18 at 11:00 AM EDT by an audio enabled telemedicine application and verified that I am speaking with the correct person using two identifiers. Patient stated full name and DOB. Patient gave permission to continue with virtual visit. Patient's location was at home and Nurse's location was at [place] office.   Health Screenings  Mammogram - 04/2013; aged out Colonoscopy - 11/2010; aged out Bone Density - 07/2017 Glaucoma -none Hearing -hearing aids.  Not worn today.  Labs followed by pcp Dental- visits every 6 months Vision- wears glasses daily  Social  Alcohol intake - yes with dinner   Smoking history- never    Smokers in home? none Illicit drug use? none Exercise - walking and active around the house Diet - No limitations regular diet Sexually Active -never BMI- discussed the importance of a healthy diet, water intake and the benefits of aerobic exercise.  Educational material provided.   Safety  Patient feels safe at home- yes, lives alone.  Patient does have smoke detectors at home- yes Patient does wear sunscreen or protective clothing when in direct sunlight -yes Patient does wear seat belt when in a moving vehicle -yes Life alert- no; patient plans to secure  Covid-19 precautions and sickness symptoms discussed.   Activities of Daily Living Patient denies needing assistance with: household chores, feeding themselves, getting from bed to chair, getting to the toilet, bathing/showering, dressing, or preparing meals.  Patient paces herself with ADLs  MCPOA assists with financial management and as needed.  Driving is limited  to several block radius.   Depression Screen Patient denies losing interest in daily life, feeling hopeless, or crying easily over simple problems.   Medication-taking as directed and without issues.   Fall Screen Patient denies being afraid of falling or falling in the last year.   Memory Screen Patient is alert.  Patient likes to read the newspaper and complete crossword puzzles for brain stimulation.  Immunizations The following Immunizations were discussed: Influenza, shingles, pneumonia, and tetanus.   Advanced Directives End of life planning; Advance aging; Advanced directives discussed.  Copy of current MCPOA requested.    Other Providers Patient Care Team: Sherlene Shamsullo, Teresa L, MD as PCP - General (Internal Medicine)  Exercise Activities and Dietary recommendations Current Exercise Habits: Home exercise routine, Type of exercise: walking,  Intensity: Mild  Goals    . Follow up with Primary Care Provider     MPOA to assist in acquiring life alert/medic alert system Increase fluid intake, stay hydrated       Fall Risk Fall Risk  09/07/2018 08/29/2017 08/24/2016 08/25/2015 06/24/2014  Falls in the past year? 0 Yes No No No  Number falls in past yr: - 1 - - -  Injury with Fall? - Yes - - -  Comment - Broke her arm.  Followed by ortho and pcp.  - - -  Follow up - Education provided;Falls prevention discussed - - -   Depression Screen PHQ 2/9 Scores 09/07/2018 08/29/2017 08/24/2016 08/25/2015  PHQ - 2 Score 0 0 0 0  PHQ- 9 Score - - 0 -     Cognitive Function MMSE - Mini Mental State Exam 08/24/2016 08/25/2015  Orientation to time 5 5  Orientation to Place 5 5  Registration 3 3  Attention/ Calculation 5 5  Recall 3 3  Language- name 2 objects 2 2  Language- repeat 1 1  Language- follow 3 step command 3 3  Language- read & follow direction 1 1  Write a sentence 1 1  Copy design 1 1  Total score 30 30     6CIT Screen 08/29/2017  What Year? 4 points  What month? 3 points   What time? 0 points  Count back from 20 0 points  Months in reverse 0 points  Repeat phrase 10 points  Total Score 17    Immunization History  Administered Date(s) Administered  . Influenza Split 01/29/2012  . Influenza, High Dose Seasonal PF 11/28/2015, 01/20/2017, 12/05/2017  . Influenza,inj,Quad PF,6+ Mos 01/01/2014  . Influenza-Unspecified 12/20/2012  . Pneumococcal Conjugate-13 07/04/2014  . Pneumococcal Polysaccharide-23 03/30/2012  . Tdap 07/19/2012  . Zoster 07/19/2012   Screening Tests Health Maintenance  Topic Date Due  . INFLUENZA VACCINE  10/21/2018  . TETANUS/TDAP  07/20/2022  . DEXA SCAN  Completed  . PNA vac Low Risk Adult  Completed      Plan:   Follow up with labs, prolia injection and protein drink pick up as directed by primary care provider.   I have personally reviewed and noted the following in the patient's chart:   . Medical and social history . Use of alcohol, tobacco or illicit drugs  . Current medications and supplements . Functional ability and status . Nutritional status . Physical activity . Advanced directives . List of other physicians . Hospitalizations, surgeries, and ER visits in previous 12 months . Vitals . Screenings to include cognitive, depression, and falls . Referrals and appointments  In addition, I have reviewed and discussed with patient certain preventive protocols, quality metrics, and best practice recommendations. A written personalized care plan for preventive services as well as general preventive health recommendations were provided to patient.     OBrien-Blaney, Karan Ramnauth L, LPN  4/[id number]/[id number]    I have reviewed the above information and agree with above.   Deborra Medina, MD

## 2018-09-09 NOTE — Assessment & Plan Note (Addendum)
Thus far she has had no behavioral changes and is no longer reporting  visual hallucinations

## 2018-09-09 NOTE — Assessment & Plan Note (Signed)
She has maintained a stable weight since Randy's church has stopped in to provide nurtitional meals for her  On a regular basis.  Darnelle Spangle to Continue to  suppplement her  meals with daily ensure

## 2018-09-09 NOTE — Assessment & Plan Note (Signed)
With history of humeral fracture which occurred in 2019  .  She is receiving Prolia.  Reviewed calcium and vitamin D needs

## 2018-09-11 ENCOUNTER — Encounter: Payer: Self-pay | Admitting: *Deleted

## 2019-01-25 ENCOUNTER — Telehealth: Payer: Self-pay

## 2019-01-25 NOTE — Telephone Encounter (Signed)
Copied from [place] [phone]. Topic: General - Inquiry >> Jan 25, 2019 12:15 PM Virl Axe D wrote: Reason for CRM: Pt's POA Louie Casa would like to speak to someone about making sure pt's bills start going to his address because they have gotten a bill from collections and he doesn't know what it for. Requesting CB. Please advise.

## 2019-05-23 ENCOUNTER — Telehealth: Payer: Self-pay | Admitting: Internal Medicine

## 2019-05-23 NOTE — Telephone Encounter (Signed)
Spoke with caregiver and advised him that it is okay for pt to get the covid vaccine as long as she has never had an anaphylactic reaction to any other vaccine.

## 2019-05-23 NOTE — Telephone Encounter (Signed)
Patient caregiver Harvie Heck called and wanted to know if it was ok for her to get the COVID vaccine

## 2019-06-11 ENCOUNTER — Telehealth: Payer: Self-pay

## 2019-06-11 DIAGNOSIS — E559 Vitamin D deficiency, unspecified: Secondary | ICD-10-CM

## 2019-06-11 DIAGNOSIS — R636 Underweight: Secondary | ICD-10-CM

## 2019-06-11 NOTE — Telephone Encounter (Signed)
Please place future lab orders. Thank you! 

## 2019-06-12 NOTE — Addendum Note (Signed)
Addended by: Sherlene Shams on: 06/12/2019 08:47 AM   Modules accepted: Orders

## 2019-06-13 ENCOUNTER — Other Ambulatory Visit (INDEPENDENT_AMBULATORY_CARE_PROVIDER_SITE_OTHER): Payer: PPO

## 2019-06-13 ENCOUNTER — Other Ambulatory Visit: Payer: Self-pay

## 2019-06-13 DIAGNOSIS — R636 Underweight: Secondary | ICD-10-CM

## 2019-06-13 DIAGNOSIS — E559 Vitamin D deficiency, unspecified: Secondary | ICD-10-CM | POA: Diagnosis not present

## 2019-06-13 LAB — COMPREHENSIVE METABOLIC PANEL
ALT: 9 U/L (ref 0–35)
AST: 22 U/L (ref 0–37)
Albumin: 4.1 g/dL (ref 3.5–5.2)
Alkaline Phosphatase: 44 U/L (ref 39–117)
BUN: 23 mg/dL (ref 6–23)
CO2: 31 mEq/L (ref 19–32)
Calcium: 9.3 mg/dL (ref 8.4–10.5)
Chloride: 104 mEq/L (ref 96–112)
Creatinine, Ser: 0.88 mg/dL (ref 0.40–1.20)
GFR: 60.22 mL/min (ref >60.00)
Glucose, Bld: 98 mg/dL (ref 70–99)
Potassium: 3.9 mEq/L (ref 3.5–5.1)
Sodium: 141 mEq/L (ref 135–145)
Total Bilirubin: 0.6 mg/dL (ref 0.2–1.2)
Total Protein: 6.3 g/dL (ref 6.0–8.3)

## 2019-06-13 LAB — VITAMIN D 25 HYDROXY (VIT D DEFICIENCY, FRACTURES): VITD: 36.27 ng/mL (ref 30.00–100.00)

## 2019-06-19 ENCOUNTER — Encounter: Payer: Self-pay | Admitting: Internal Medicine

## 2019-06-19 ENCOUNTER — Other Ambulatory Visit: Payer: Self-pay

## 2019-06-19 ENCOUNTER — Ambulatory Visit (INDEPENDENT_AMBULATORY_CARE_PROVIDER_SITE_OTHER): Payer: PPO | Admitting: Internal Medicine

## 2019-06-19 DIAGNOSIS — F0150 Vascular dementia without behavioral disturbance: Secondary | ICD-10-CM | POA: Diagnosis not present

## 2019-06-19 DIAGNOSIS — R441 Visual hallucinations: Secondary | ICD-10-CM

## 2019-06-19 DIAGNOSIS — R636 Underweight: Secondary | ICD-10-CM | POA: Diagnosis not present

## 2019-06-19 NOTE — Patient Instructions (Signed)
You are doing a great job with Courtney Newton.   I do recommend a female aide to help improve her hygiene  Flushable wet wipes would be a great addition to the bathroom near the toilet paper   Labs looked fine last week.   see you in 6 months. Sooner if needed

## 2019-06-19 NOTE — Progress Notes (Signed)
Subjective:  Patient ID: Courtney Newton, female    DOB: [birth year]  Age: 86 y.o. MRN: [id number]  CC: Diagnoses of Underweight, Dementia, vascular, mixed, without behavioral disturbance ([place]), and Episodes of formed visual hallucinations were pertinent to this visit.  HPI Courtney Newton presents for follow up on dementia mixed, with Alzheimer and vascular  With visual  hallucinations.   This visit occurred during the SARS-CoV-2 public health emergency.  Safety protocols were in place, including screening questions prior to the visit, additional usage of staff PPE, and extensive cleaning of exam room while observing appropriate contact time as indicated for disinfecting solutions.   Courtney Newton is a 86 yr old female with mixed Alzheimers and vascular dementia .  She remains functionally independent with assistance from community members including her pastor, Louie Casa , who supervises her meals . Patient has gained 7 lbs largely through the efforts of pastor randy being present at mealtime.   He notes that she has been having recurrent visual hallucinations  Of 4  Men/boys.  She has become quite fond of a stuffed animal (her pet dog) and puts out food for it.  She  no  longer remembers her husband as her  husband,  ("he was my brother") . He has been concerned about her lack of attention to personal hygiene.  He notices that she becomes overstimulated during [place] which he hs as been taking her on twice a year.  She has become     Patient has received the [place] 19 vaccine without complications.  Patient continues to mask when outside of the home except when walking in yard or at safe distances from others .  Patient denies any change in mood or development of unhealthy behaviors resuting from the pandemic's restriction of activities and socialization.       No outpatient medications prior to visit.   Facility-Administered Medications Prior to Visit  Medication  Dose Route Frequency Provider Last Rate Last Admin  . pneumococcal 23 valent vaccine (PNU-IMMUNE) injection 0.5 mL  0.5 mL Intramuscular Once Crecencio Mc, MD        Review of Systems;  Patient denies headache, fevers, malaise, unintentional weight loss, skin rash, eye pain, sinus congestion and sinus pain, sore throat, dysphagia,  hemoptysis , cough, dyspnea, wheezing, chest pain, palpitations, orthopnea, edema, abdominal pain, nausea, melena, diarrhea, constipation, flank pain, dysuria, hematuria, urinary  Frequency, nocturia, numbness, tingling, seizures,  Focal weakness, Loss of consciousness,  Tremor, insomnia, depression, anxiety, and suicidal ideation.      Objective:  BP 120/60 (BP Location: Left Arm, Patient Position: Sitting, Cuff Size: Normal)   Pulse 94   Temp 97.7 F (36.5 C) (Temporal)   Resp 15   Ht 5\' 3"  (1.6 m)   Wt 102 lb 3.2 oz (46.4 kg)   SpO2 93%   BMI 18.10 kg/m   BP Readings from Last 3 Encounters:  06/19/19 120/60  02/06/18 130/76  12/05/17 128/82    Wt Readings from Last 3 Encounters:  06/19/19 102 lb 3.2 oz (46.4 kg)  02/06/18 95 lb 6.4 oz (43.3 kg)  12/05/17 95 lb 3.2 oz (43.2 kg)    General appearance: alert, cooperative and appears stated age Ears: normal TM's and external ear canals both ears Throat: lips, mucosa, and tongue normal; teeth and gums normal Neck: no adenopathy, no carotid bruit, supple, symmetrical, trachea midline and thyroid not enlarged, symmetric, no tenderness/mass/nodules Back: symmetric, no curvature. ROM normal.  No CVA tenderness. Lungs: clear to auscultation bilaterally Heart: regular rate and rhythm, S1, S2 normal, no murmur, click, rub or gallop Abdomen: soft, non-tender; bowel sounds normal; no masses,  no organomegaly Pelvic:  She appears to have caked fecal matter in her underwear and on her perineal area  Pulses: 2+ and symmetric Skin: Skin color, texture, turgor normal. No rashes or lesions Lymph nodes:  Cervical, supraclavicular, and axillary nodes normal.  No results found for: HGBA1C  Lab Results  Component Value Date   CREATININE 0.88 06/13/2019   CREATININE 0.79 09/07/2018   CREATININE 0.89 02/06/2018    Lab Results  Component Value Date   WBC 7.3 09/07/2018   HGB 13.3 09/07/2018   HCT 39.7 09/07/2018   PLT 201.0 09/07/2018   GLUCOSE 98 06/13/2019   CHOL 151 07/08/2015   TRIG 67 07/08/2015   HDL 43 07/08/2015   LDLDIRECT 137.8 04/06/2012   LDLCALC 95 07/08/2015   ALT 9 06/13/2019   AST 22 06/13/2019   NA 141 06/13/2019   K 3.9 06/13/2019   CL 104 06/13/2019   CREATININE 0.88 06/13/2019   BUN 23 06/13/2019   CO2 31 06/13/2019   TSH 3.96 09/07/2018    MR BRAIN WO CONTRAST  Result Date: 10/04/2017 CLINICAL DATA:  Progressive memory loss since February of this year. EXAM: MRI HEAD WITHOUT CONTRAST TECHNIQUE: Multiplanar, multiecho pulse sequences of the brain and surrounding structures were obtained without intravenous contrast. COMPARISON:  None. FINDINGS: Brain: Diffusion imaging does not show any acute or subacute infarction. Mild chronic small-vessel change of the pons. No focal cerebellar insult. Cerebral hemispheres show age related atrophy with mild to moderate chronic small-vessel ischemic changes of the white matter. Atrophy is slightly temporal lobe predominant. No cortical or large vessel territory infarction. No mass lesion, hemorrhage, hydrocephalus or extra-axial collection. Vascular: Major vessels at the base of the brain show flow. Skull and upper cervical spine: Negative Sinuses/Orbits: Clear/normal Other: None IMPRESSION: No acute or reversible finding. Brain atrophy, somewhat temporal lobe predominant. Mild to moderate chronic small-vessel ischemic changes of the cerebral hemispheric white matter. Electronically Signed   By: Paulina Fusi M.D.   On: 10/04/2017 12:50    Assessment & Plan:   Problem List Items Addressed This Visit      Unprioritized    Underweight    She has gained weight with nutritional supervision       Dementia, vascular, mixed, without behavioral disturbance (HCC)    She remains happily independent supported by community members.  Attention to personal hygiene is poor; recommending adding a female aide to assist with bathing 3/week      Episodes of formed visual hallucinations     due to her dementia.  there is no sign of metabolic, endocrine or infectious  etiologies  .  (Tsh is only minimally elevated.)   Will refrain from using anti psychotics as the sightings do not result in agitation or paranoia at this time.         I provided  30 minutes of  face-to-face time during this encounter reviewing patient's current problems and past surgeries, labs and imaging studies, providing counseling on the above mentioned problems , and coordination  of care .  We will continue to administer pneumococcal 23 valent vaccine.  No orders of the defined types were placed in this encounter.   There are no discontinued medications.  Follow-up: No follow-ups on file.   Sherlene Shams, MD

## 2019-06-20 NOTE — Assessment & Plan Note (Signed)
due to her dementia.  there is no sign of metabolic, endocrine or infectious  etiologies  .  (Tsh is only minimally elevated.)   Will refrain from using anti psychotics as the sightings do not result in agitation or paranoia at this time.

## 2019-06-20 NOTE — Assessment & Plan Note (Signed)
She has gained weight with nutritional supervision

## 2019-06-20 NOTE — Assessment & Plan Note (Signed)
She remains happily independent supported by community members.  Attention to personal hygiene is poor; recommending adding a female aide to assist with bathing 3/week

## 2019-09-10 ENCOUNTER — Ambulatory Visit (INDEPENDENT_AMBULATORY_CARE_PROVIDER_SITE_OTHER): Payer: PPO

## 2019-09-10 VITALS — Ht 63.0 in | Wt 102.0 lb

## 2019-09-10 DIAGNOSIS — Z0000 Encounter for general adult medical examination without abnormal findings: Secondary | ICD-10-CM | POA: Diagnosis not present

## 2019-09-10 NOTE — Patient Instructions (Addendum)
[name] , Thank you for taking time to come for your Medicare Wellness Visit. I appreciate your ongoing commitment to your health goals. Please review the following plan we discussed and let me know if I can assist you in the future.   These are the goals we discussed: Goals    . Follow up with Primary Care Provider     As needed       This is a list of the screening recommended for you and due dates:  Health Maintenance  Topic Date Due  . Flu Shot  10/21/2019  . Tetanus Vaccine  07/20/2022  . DEXA scan (bone density measurement)  Completed  . COVID-19 Vaccine  Completed  . Pneumonia vaccines  Completed    Immunizations Immunization History  Administered Date(s) Administered  . Influenza Split 01/29/2012  . Influenza, High Dose Seasonal PF 11/28/2015, 01/20/2017, 12/05/2017  . Influenza,inj,Quad PF,6+ Mos 01/01/2014  . Influenza-Unspecified 12/20/2012  . Janssen (J&J) SARS-COV-2 Vaccination 06/18/2019  . Pneumococcal Conjugate-13 07/04/2014  . Pneumococcal Polysaccharide-23 03/30/2012  . Tdap 07/19/2012   Keep all routine maintenance appointments.   Next scheduled follow up 09/25/19 @ 1:00  Follow up  12/21/19 @ 2:00  Advanced directives: declined  Follow up in one year for your annual wellness visit   Preventive Care 65 Years and Older, Female Preventive care refers to lifestyle choices and visits with your health care provider that can promote health and wellness. What does preventive care include?  A yearly physical exam. This is also called an annual well check.  Dental exams once or twice a year.  Routine eye exams. Ask your health care provider how often you should have your eyes checked.  Personal lifestyle choices, including:  Daily care of your teeth and gums.  Regular physical activity.  Eating a healthy diet.  Avoiding tobacco and drug use.  Limiting alcohol use.  Practicing safe sex.  Taking low-dose aspirin every day.  Taking vitamin  and mineral supplements as recommended by your health care provider. What happens during an annual well check? The services and screenings done by your health care provider during your annual well check will depend on your age, overall health, lifestyle risk factors, and family history of disease. Counseling  Your health care provider may ask you questions about your:  Alcohol use.  Tobacco use.  Drug use.  Emotional well-being.  Home and relationship well-being.  Sexual activity.  Eating habits.  History of falls.  Memory and ability to understand (cognition).  Work and work Astronomer.  Reproductive health. Screening  You may have the following tests or measurements:  Height, weight, and BMI.  Blood pressure.  Lipid and cholesterol levels. These may be checked every 5 years, or more frequently if you are over 40 years old.  Skin check.  Lung cancer screening. You may have this screening every year starting at age 32 if you have a 30-pack-year history of smoking and currently smoke or have quit within the past 15 years.  Fecal occult blood test (FOBT) of the stool. You may have this test every year starting at age 61.  Flexible sigmoidoscopy or colonoscopy. You may have a sigmoidoscopy every 5 years or a colonoscopy every 10 years starting at age 66.  Hepatitis C blood test.  Hepatitis B blood test.  Sexually transmitted disease (STD) testing.  Diabetes screening. This is done by checking your blood sugar (glucose) after you have not eaten for a while (fasting). You may have this  done every 1-3 years.  Bone density scan. This is done to screen for osteoporosis. You may have this done starting at age 31.  Mammogram. This may be done every 1-2 years. Talk to your health care provider about how often you should have regular mammograms. Talk with your health care provider about your test results, treatment options, and if necessary, the need for more  tests. Vaccines  Your health care provider may recommend certain vaccines, such as:  Influenza vaccine. This is recommended every year.  Tetanus, diphtheria, and acellular pertussis (Tdap, Td) vaccine. You may need a Td booster every 10 years.  Zoster vaccine. You may need this after age 84.  Pneumococcal 13-valent conjugate (PCV13) vaccine. One dose is recommended after age 78.  Pneumococcal polysaccharide (PPSV23) vaccine. One dose is recommended after age 11. Talk to your health care provider about which screenings and vaccines you need and how often you need them. This information is not intended to replace advice given to you by your health care provider. Make sure you discuss any questions you have with your health care provider. Document Released: 04/04/2015 Document Revised: 11/26/2015 Document Reviewed: 01/07/2015 Elsevier Interactive Patient Education  2017 [employer] Prevention in the Home Falls can cause injuries. They can happen to people of all ages. There are many things you can do to make your home safe and to help prevent falls. What can I do on the outside of my home?  Regularly fix the edges of walkways and driveways and fix any cracks.  Remove anything that might make you trip as you walk through a door, such as a raised step or threshold.  Trim any bushes or trees on the path to your home.  Use bright outdoor lighting.  Clear any walking paths of anything that might make someone trip, such as rocks or tools.  Regularly check to see if handrails are loose or broken. Make sure that both sides of any steps have handrails.  Any raised decks and porches should have guardrails on the edges.  Have any leaves, snow, or ice cleared regularly.  Use sand or salt on walking paths during winter.  Clean up any spills in your garage right away. This includes oil or grease spills. What can I do in the bathroom?  Use night lights.  Install grab bars by the  toilet and in the tub and shower. Do not use towel bars as grab bars.  Use non-skid mats or decals in the tub or shower.  If you need to sit down in the shower, use a plastic, non-slip stool.  Keep the floor dry. Clean up any water that spills on the floor as soon as it happens.  Remove soap buildup in the tub or shower regularly.  Attach bath mats securely with double-sided non-slip rug tape.  Do not have throw rugs and other things on the floor that can make you trip. What can I do in the bedroom?  Use night lights.  Make sure that you have a light by your bed that is easy to reach.  Do not use any sheets or blankets that are too big for your bed. They should not hang down onto the floor.  Have a firm chair that has side arms. You can use this for support while you get dressed.  Do not have throw rugs and other things on the floor that can make you trip. What can I do in the kitchen?  Clean up any spills  right away.  Avoid walking on wet floors.  Keep items that you use a lot in easy-to-reach places.  If you need to reach something above you, use a strong step stool that has a grab bar.  Keep electrical cords out of the way.  Do not use floor polish or wax that makes floors slippery. If you must use wax, use non-skid floor wax.  Do not have throw rugs and other things on the floor that can make you trip. What can I do with my stairs?  Do not leave any items on the stairs.  Make sure that there are handrails on both sides of the stairs and use them. Fix handrails that are broken or loose. Make sure that handrails are as long as the stairways.  Check any carpeting to make sure that it is firmly attached to the stairs. Fix any carpet that is loose or worn.  Avoid having throw rugs at the top or bottom of the stairs. If you do have throw rugs, attach them to the floor with carpet tape.  Make sure that you have a light switch at the top of the stairs and the bottom of  the stairs. If you do not have them, ask someone to add them for you. What else can I do to help prevent falls?  Wear shoes that:  Do not have high heels.  Have rubber bottoms.  Are comfortable and fit you well.  Are closed at the toe. Do not wear sandals.  If you use a stepladder:  Make sure that it is fully opened. Do not climb a closed stepladder.  Make sure that both sides of the stepladder are locked into place.  Ask someone to hold it for you, if possible.  Clearly mark and make sure that you can see:  Any grab bars or handrails.  First and last steps.  Where the edge of each step is.  Use tools that help you move around (mobility aids) if they are needed. These include:  Canes.  Walkers.  Scooters.  Crutches.  Turn on the lights when you go into a dark area. Replace any light bulbs as soon as they burn out.  Set up your furniture so you have a clear path. Avoid moving your furniture around.  If any of your floors are uneven, fix them.  If there are any pets around you, be aware of where they are.  Review your medicines with your doctor. Some medicines can make you feel dizzy. This can increase your chance of falling. Ask your doctor what other things that you can do to help prevent falls. This information is not intended to replace advice given to you by your health care provider. Make sure you discuss any questions you have with your health care provider. Document Released: 01/02/2009 Document Revised: 08/14/2015 Document Reviewed: 04/12/2014 Elsevier Interactive Patient Education  2017 [employer].

## 2019-09-10 NOTE — Progress Notes (Addendum)
Subjective:   Courtney Newton is a 86 y.o. female who presents for Medicare Annual (Subsequent) preventive examination.  Review of Systems    No ROS.  Medicare Wellness Virtual Visit.   Cardiac Risk Factors include: advanced age (>68men, >50 women)     Objective:    Today's Vitals   09/10/19 1037  Weight: 102 lb (46.3 kg)  Height: 5\' 3"  (1.6 m)   Body mass index is 18.07 kg/m.  Advanced Directives 09/10/2019 09/07/2018 08/29/2017 08/24/2016 08/25/2015 07/06/2015 07/06/2015  Does Patient Have a Medical Advance Directive? No Yes No No Yes No No  Type of Advance Directive - Healthcare Power of Attorney - - Living will;Healthcare Power of Attorney - -  Does patient want to make changes to medical advance directive? No - Patient declined No - Patient declined - - - - -  Copy of 07/08/2015 in Chart? - - - - No - copy requested - -  Would patient like information on creating a medical advance directive? - - No - Patient declined Yes (MAU/Ambulatory/Procedural Areas - Information given) - No - patient declined information No - patient declined information    Current Medications (verified) No outpatient encounter medications on file as of 09/10/2019.   Facility-Administered Encounter Medications as of 09/10/2019  Medication  . pneumococcal 23 valent vaccine (PNU-IMMUNE) injection 0.5 mL    Allergies (verified) Lovastatin   History: Past Medical History:  Diagnosis Date  . History of lumpectomy of both breasts   . Osteoporosis   . Vitamin D deficiency    Past Surgical History:  Procedure Laterality Date  . ABDOMINAL HYSTERECTOMY  1950  . APPENDECTOMY    . BILATERAL OOPHORECTOMY  1950   Family History  Problem Relation Age of Onset  . Heart disease Mother   . Heart disease Father    Social History   Socioeconomic History  . Marital status: Widowed    Spouse name: Not on file  . Number of children: Not on file  . Years of education: Not on file  . Highest  education level: Not on file  Occupational History  . Not on file  Tobacco Use  . Smoking status: Never Smoker  . Smokeless tobacco: Never Used  Vaping Use  . Vaping Use: Never used  Substance and Sexual Activity  . Alcohol use: Yes    Comment: 4oz with dinner  . Drug use: No  . Sexual activity: Never  Other Topics Concern  . Not on file  Social History Narrative  . Not on file   Social Determinants of Health   Financial Resource Strain:   . Difficulty of Paying Living Expenses:   Food Insecurity:   . Worried About 09/12/2019 in the Last Year:   . Programme researcher, broadcasting/film/video in the Last Year:   Transportation Needs:   . Barista (Medical):   Freight forwarder Lack of Transportation (Non-Medical):   Physical Activity:   . Days of Exercise per Week:   . Minutes of Exercise per Session:   Stress:   . Feeling of Stress :   Social Connections:   . Frequency of Communication with Friends and Family:   . Frequency of Social Gatherings with Friends and Family:   . Attends Religious Services:   . Active Member of Clubs or Organizations:   . Attends [employer] Meetings:   Banker Marital Status:     Tobacco Counseling Counseling given: Not Answered  Clinical Intake:  Pre-visit preparation completed: Yes        Diabetes: No  How often do you need to have someone help you when you read instructions, pamphlets, or other written materials from your doctor or pharmacy?: 4 - Often  Diabetic? No  Interpreter Needed?: No      Activities of Daily Living In your present state of health, do you have any difficulty performing the following activities: 09/10/2019  Hearing? Y  Comment Hearing aids  Vision? N  Difficulty concentrating or making decisions? Y  Comment Short term memory loss  Walking or climbing stairs? N  Dressing or bathing? N  Doing errands, shopping? Y  Preparing Food and eating ? Y  Using the Toilet? N  In the past six months, have you accidently  leaked urine? N  Do you have problems with loss of bowel control? N  Managing your Medications? N  Managing your Finances? Y  Housekeeping or managing your Housekeeping? Y  Some recent data might be hidden    Patient Care Team: Crecencio Mc, MD as PCP - General (Internal Medicine)  Indicate any recent Medical Services you may have received from other than [facility] providers in the past year (date may be approximate).     Assessment:   This is a routine wellness examination for Courtney Newton.  I connected with Demetra today by telephone and verified that I am speaking with the correct person using two identifiers. Information also received from [employer], HIPAA compliant.  Location patient: home Location provider: work Persons participating in the virtual visit: patient, Marine scientist.    I discussed the limitations, risks, security and privacy concerns of performing an evaluation and management service by telephone and the availability of in person appointments. The patient expressed understanding and verbally consented to this telephonic visit.    Interactive audio and video telecommunications were attempted between this provider and patient, however failed, due to patient having technical difficulties OR patient did not have access to video capability.  We continued and completed visit with audio only.  Some vital signs may be absent or patient reported.   Hearing/Vision screen  Hearing Screening   125Hz  250Hz  500Hz  1000Hz  2000Hz  3000Hz  4000Hz  6000Hz  8000Hz   Right ear:           Left ear:           Comments: Hearing aids, bilateral    Vision Screening Comments: Wears corrective lenses Visual acuity not assessed, virtual visit.  They have seen their ophthalmologist.     Dietary issues and exercise activities discussed: Current Exercise Habits: The patient does not participate in regular exercise at present  Goals    . Follow up with Primary Care Provider     As needed      Depression  Screen PHQ 2/9 Scores 09/10/2019 09/07/2018 08/29/2017 08/24/2016 08/25/2015 06/24/2014 05/07/2014  PHQ - 2 Score 0 0 0 0 0 0 0  PHQ- 9 Score - - - 0 - - -    Fall Risk Fall Risk  09/10/2019 06/19/2019 09/07/2018 08/29/2017 08/24/2016  Falls in the past year? 0 0 0 Yes No  Number falls in past yr: 0 - - 1 -  Injury with Fall? - - - Yes -  Comment - - - Broke her arm.  Followed by ortho and pcp.  -  Follow up Falls evaluation completed Falls evaluation completed - Education provided;Falls prevention discussed -    Handrails in use when using stairs? Yes  Home free  of loose throw rugs in walkways, pet beds, electrical cords, etc? Yes  Adequate lighting in your home to reduce risk of falls? Yes   ASSISTIVE DEVICES UTILIZED TO PREVENT FALLS:  Life alert? No  Use of a cane, walker or w/c? No  Grab bars in the bathroom? No  Shower chair or bench in shower? No  Elevated toilet seat or a handicapped toilet? No   TIMED UP AND GO:  Was the test performed? No . Virtual visit.  Cognitive Function: MMSE - Mini Mental State Exam 09/10/2019 08/24/2016 08/25/2015  Not completed: Unable to complete - -  Orientation to time - 5 5  Orientation to Place - 5 5  Registration - 3 3  Attention/ Calculation - 5 5  Recall - 3 3  Language- name 2 objects - 2 2  Language- repeat - 1 1  Language- follow 3 step command - 3 3  Language- read & follow direction - 1 1  Write a sentence - 1 1  Copy design - 1 1  Total score - 30 30     6CIT Screen 08/29/2017  What Year? 4 points  What month? 3 points  What time? 0 points  Count back from 20 0 points  Months in reverse 0 points  Repeat phrase 10 points  Total Score 17    Immunizations Immunization History  Administered Date(s) Administered  . Influenza Split 01/29/2012  . Influenza, High Dose Seasonal PF 11/28/2015, 01/20/2017, 12/05/2017  . Influenza,inj,Quad PF,6+ Mos 01/01/2014  . Influenza-Unspecified 12/20/2012  . Janssen (J&J) SARS-COV-2 Vaccination  06/18/2019  . Pneumococcal Conjugate-13 07/04/2014  . Pneumococcal Polysaccharide-23 03/30/2012  . Tdap 07/19/2012  . Zoster 07/19/2012   Health Maintenance Health Maintenance  Topic Date Due  . INFLUENZA VACCINE  10/21/2019  . TETANUS/TDAP  07/20/2022  . DEXA SCAN  Completed  . COVID-19 Vaccine  Completed  . PNA vac Low Risk Adult  Completed    Colorectal cancer screening: No longer required.  Mammogram status: No longer required.   Lung Cancer Screening: (Low Dose CT Chest recommended if Age 47-80 years, 30 pack-year currently smoking OR have quit w/in 15years.) does not qualify.   Hepatitis C Screening: does not qualify.  Vision Screening: Recommended annual ophthalmology exams for early detection of glaucoma and other disorders of the eye.  Dental Screening: Recommended annual dental exams for proper oral hygiene  Community Resource Referral / Chronic Care Management: CRR required this visit?  No  CCM required this visit?  No     Plan:   Keep all routine maintenance appointments.   Next scheduled follow up 09/25/19 @ 1:00  Follow up  12/21/19 @ 2:00  I have personally reviewed and noted the following in the patient's chart:   . Medical and social history . Use of alcohol, tobacco or illicit drugs  . Current medications and supplements . Functional ability and status . Nutritional status . Physical activity . Advanced directives . List of other physicians . Hospitalizations, surgeries, and ER visits in previous 12 months . Vitals . Screenings to include cognitive, depression, and falls . Referrals and appointments  In addition, I have reviewed and discussed with patient certain preventive protocols, quality metrics, and best practice recommendations. A written personalized care plan for preventive services as well as general preventive health recommendations were provided to patient via mail.     OBrien-Blaney, Beva Remund L, LPN   0/88/1103    I have  reviewed the above information and agree with  above.   Duncan Dull, MD

## 2019-09-25 ENCOUNTER — Other Ambulatory Visit: Payer: Self-pay

## 2019-09-25 ENCOUNTER — Ambulatory Visit (INDEPENDENT_AMBULATORY_CARE_PROVIDER_SITE_OTHER): Payer: PPO | Admitting: Internal Medicine

## 2019-09-25 ENCOUNTER — Encounter: Payer: Self-pay | Admitting: Internal Medicine

## 2019-09-25 DIAGNOSIS — F0150 Vascular dementia without behavioral disturbance: Secondary | ICD-10-CM

## 2019-09-25 NOTE — Progress Notes (Signed)
Subjective:  Patient ID: Courtney Newton, female    DOB: [birth year]  Age: 86 y.o. MRN: [id number]  CC: The encounter diagnosis was Dementia, vascular, mixed, without behavioral disturbance (HCC).  HPI Courtney Newton presents for follow up on vascular dementia, underweight and osteoporosis   This visit occurred during the SARS-CoV-2 public health emergency.  Safety protocols were in place, including screening questions prior to the visit, additional usage of staff PPE, and extensive cleaning of exam room while observing appropriate contact time as indicated for disinfecting solutions.    Patient has received both doses of the available COVID 19 vaccine without complications.  Patient continues to mask when outside of the home except when walking in yard or at safe distances from others .  Patient denies any change in mood or development of unhealthy behaviors resuting from the pandemic's restriction of activities and socialization.     her pastor, [name],  has noticed increased disorientation since April. She received covid 19 vaccine march 29 and went on a biannual with the church trip which she enjoyed,  But became confused and delusional upon return home.  Thought the home was her mothers, and her mother was missing.  .  Hygiene still an issue has not showered in months.  Has been Wearing the same clothes for 5 weeks. Will not brush teeth or use an oral rinse.  Has seen the dentist    No outpatient medications prior to visit.   Facility-Administered Medications Prior to Visit  Medication Dose Route Frequency Provider Last Rate Last Admin  . pneumococcal 23 valent vaccine (PNU-IMMUNE) injection 0.5 mL  0.5 mL Intramuscular Once Sherlene Shams, MD        Review of Systems;  Patient denies headache, fevers, malaise, unintentional weight loss, skin rash, eye pain, sinus congestion and sinus pain, sore throat, dysphagia,  hemoptysis , cough, dyspnea, wheezing, chest pain, palpitations,  orthopnea, edema, abdominal pain, nausea, melena, diarrhea, constipation, flank pain, dysuria, hematuria, urinary  Frequency, nocturia, numbness, tingling, seizures,  Focal weakness, Loss of consciousness,  Tremor, insomnia, depression, anxiety, and suicidal ideation.      Objective:  BP (!) 142/80 (BP Location: Left Arm, Patient Position: Sitting, Cuff Size: Normal)   Pulse 82   Temp 98 F (36.7 C) (Temporal)   Resp 15   Ht 5\' 3"  (1.6 m)   Wt 101 lb 3.2 oz (45.9 kg)   SpO2 95%   BMI 17.93 kg/m   BP Readings from Last 3 Encounters:  09/25/19 (!) 142/80  06/19/19 120/60  02/06/18 130/76    Wt Readings from Last 3 Encounters:  09/25/19 101 lb 3.2 oz (45.9 kg)  09/10/19 102 lb (46.3 kg)  06/19/19 102 lb 3.2 oz (46.4 kg)    General appearance: alert, confused, happy .  Clothes are dirty  ears: normal TM's and external ear canals both ears Throat: lips, mucosa, and tongue normal; teeth and gums normal Neck: no adenopathy, no carotid bruit, supple, symmetrical, trachea midline and thyroid not enlarged, symmetric, no tenderness/mass/nodules Back: symmetric, no curvature. ROM normal. No CVA tenderness. Lungs: clear to auscultation bilaterally Heart: regular rate and rhythm, S1, S2 normal, no murmur, click, rub or gallop Abdomen: soft, non-tender; bowel sounds normal; no masses,  no organomegaly Pulses: 2+ and symmetric Skin: Skin color, texture, turgor normal. No rashes or lesions Lymph nodes: Cervical, supraclavicular, and axillary nodes normal. Neuro:  awake and interactive with normal mood and affect. Higher cortical functions are not intact .  Speech is clear but limited vocabulary due to word-finding difficulty .  NO memeory or recent holiday or events.  Extraocular movements are intact. . Sensation to light touch is grossly intact bilaterally of upper and lower extremities. Motor examination shows 4+/5 symmetric hand grip and upper extremity and 5/5 lower extremity strength.  There is no pronation or drift. Gait is non-ataxic    No results found for: HGBA1C  Lab Results  Component Value Date   CREATININE 0.88 06/13/2019   CREATININE 0.79 09/07/2018   CREATININE 0.89 02/06/2018    Lab Results  Component Value Date   WBC 7.3 09/07/2018   HGB 13.3 09/07/2018   HCT 39.7 09/07/2018   PLT 201.0 09/07/2018   GLUCOSE 98 06/13/2019   CHOL 151 07/08/2015   TRIG 67 07/08/2015   HDL 43 07/08/2015   LDLDIRECT 137.8 04/06/2012   LDLCALC 95 07/08/2015   ALT 9 06/13/2019   AST 22 06/13/2019   NA 141 06/13/2019   K 3.9 06/13/2019   CL 104 06/13/2019   CREATININE 0.88 06/13/2019   BUN 23 06/13/2019   CO2 31 06/13/2019   TSH 3.96 09/07/2018    MR BRAIN WO CONTRAST  Result Date: 10/04/2017 CLINICAL DATA:  Progressive memory loss since February of this year. EXAM: MRI HEAD WITHOUT CONTRAST TECHNIQUE: Multiplanar, multiecho pulse sequences of the brain and surrounding structures were obtained without intravenous contrast. COMPARISON:  None. FINDINGS: Brain: Diffusion imaging does not show any acute or subacute infarction. Mild chronic small-vessel change of the pons. No focal cerebellar insult. Cerebral hemispheres show age related atrophy with mild to moderate chronic small-vessel ischemic changes of the white matter. Atrophy is slightly temporal lobe predominant. No cortical or large vessel territory infarction. No mass lesion, hemorrhage, hydrocephalus or extra-axial collection. Vascular: Major vessels at the base of the brain show flow. Skull and upper cervical spine: Negative Sinuses/Orbits: Clear/normal Other: None IMPRESSION: No acute or reversible finding. Brain atrophy, somewhat temporal lobe predominant. Mild to moderate chronic small-vessel ischemic changes of the cerebral hemispheric white matter. Electronically Signed   By: Paulina Fusi M.D.   On: 10/04/2017 12:50    Assessment & Plan:   Problem List Items Addressed This Visit      Unprioritized    Dementia, vascular, mixed, without behavioral disturbance (HCC)    Per her POA she has become increasingly confused and intermittently frustrated at times without being combative.  She has not had any falls , no longer drives,  And continues to live alone with daily visitations by church family to provide meals .  Recommending transition to memory care facility          I provided  30 minutes of  face-to-face time during this encounter reviewing patient's current problems and past surgeries, labs and imaging studies, providing counseling on the above mentioned problems , and coordination  of care . We will continue to administer pneumococcal 23 valent vaccine.  No orders of the defined types were placed in this encounter.   There are no discontinued medications.  Follow-up: No follow-ups on file.   Sherlene Shams, MD

## 2019-09-25 NOTE — Patient Instructions (Signed)
You are in good shape!!   You are very blessed to have such wonderful friends who take care of you!  DON'T FORGET THAT YOU NEED TO SHOWER ONCE A WEEK  BRUSH YOUR TEETH EVERY DAY

## 2019-09-25 NOTE — Assessment & Plan Note (Signed)
Per her POA she has become increasingly confused and intermittently frustrated at times without being combative.  She has not had any falls , no longer drives,  And continues to live alone with daily visitations by church family to provide meals .  Recommending transition to memory care facility

## 2019-12-14 ENCOUNTER — Other Ambulatory Visit: Payer: Self-pay

## 2019-12-14 ENCOUNTER — Ambulatory Visit (INDEPENDENT_AMBULATORY_CARE_PROVIDER_SITE_OTHER): Payer: PPO

## 2019-12-14 DIAGNOSIS — Z23 Encounter for immunization: Secondary | ICD-10-CM

## 2019-12-21 ENCOUNTER — Encounter: Payer: Self-pay | Admitting: Internal Medicine

## 2019-12-21 ENCOUNTER — Ambulatory Visit (INDEPENDENT_AMBULATORY_CARE_PROVIDER_SITE_OTHER): Payer: PPO | Admitting: Internal Medicine

## 2019-12-21 ENCOUNTER — Other Ambulatory Visit: Payer: Self-pay

## 2019-12-21 DIAGNOSIS — F0150 Vascular dementia without behavioral disturbance: Secondary | ICD-10-CM | POA: Diagnosis not present

## 2019-12-21 NOTE — Progress Notes (Signed)
Subjective:  Patient ID: Courtney Newton, female    DOB: December 30, 1926  Age: 86 y.o. MRN: [id number]  CC: The encounter diagnosis was Dementia, vascular, mixed, without behavioral disturbance (HCC).  HPI Courtney Newton presents for 6 month follow up on dementia.  This visit occurred during the SARS-CoV-2 public health emergency.  Safety protocols were in place, including screening questions prior to the visit, additional usage of staff PPE, and extensive cleaning of exam room while observing appropriate contact time as indicated for disinfecting solutions.   She continues to live at home with supervision by her church family.  She is accompanied  By her pastor who coordinates her care and checks on her frequently.  She has grown increasingly reluctant to bathe or change her clothes , even when they are outwardly dirty.  She is argumentative when confronted with her need to change clothes but otherwise has no behavioral issues.   No outpatient medications prior to visit.   Facility-Administered Medications Prior to Visit  Medication Dose Route Frequency Provider Last Rate Last Admin  . pneumococcal 23 valent vaccine (PNU-IMMUNE) injection 0.5 mL  0.5 mL Intramuscular Once Sherlene Shams, MD        Review of Systems;  Patient denies headache, fevers, malaise, unintentional weight loss, skin rash, eye pain, sinus congestion and sinus pain, sore throat, dysphagia,  hemoptysis , cough, dyspnea, wheezing, chest pain, palpitations, orthopnea, edema, abdominal pain, nausea, melena, diarrhea, constipation, flank pain, dysuria, hematuria, urinary  Frequency, nocturia, numbness, tingling, seizures,  Focal weakness, Loss of consciousness,  Tremor, insomnia, depression, anxiety, and suicidal ideation.      Objective:  BP 136/68 (BP Location: Left Arm, Patient Position: Sitting)   Pulse 91   Temp 98.6 F (37 C)   Ht 5' 2.99" (1.6 m)   Wt 95 lb 3.2 oz (43.2 kg)   SpO2 93%   BMI 16.87 kg/m   BP  Readings from Last 3 Encounters:  12/21/19 136/68  09/25/19 (!) 142/80  06/19/19 120/60    Wt Readings from Last 3 Encounters:  12/21/19 95 lb 3.2 oz (43.2 kg)  09/25/19 101 lb 3.2 oz (45.9 kg)  09/10/19 102 lb (46.3 kg)    General appearance: alert, cooperative and appears stated age Ears: normal TM's and external ear canals both ears Throat: lips, mucosa, and tongue normal; teeth and gums normal Neck: no adenopathy, no carotid bruit, supple, symmetrical, trachea midline and thyroid not enlarged, symmetric, no tenderness/mass/nodules Back: symmetric, no curvature. ROM normal. No CVA tenderness. Lungs: clear to auscultation bilaterally Heart: regular rate and rhythm, S1, S2 normal, no murmur, click, rub or gallop Abdomen: soft, non-tender; bowel sounds normal; no masses,  no organomegaly Pulses: 2+ and symmetric Skin: review of skin shoes no ulcerations, rashes or lesions Lymph nodes: Cervical, supraclavicular, and axillary nodes normal.  No results found for: HGBA1C  Lab Results  Component Value Date   CREATININE 0.88 06/13/2019   CREATININE 0.79 09/07/2018   CREATININE 0.89 02/06/2018    Lab Results  Component Value Date   WBC 7.3 09/07/2018   HGB 13.3 09/07/2018   HCT 39.7 09/07/2018   PLT 201.0 09/07/2018   GLUCOSE 98 06/13/2019   CHOL 151 07/08/2015   TRIG 67 07/08/2015   HDL 43 07/08/2015   LDLDIRECT 137.8 04/06/2012   LDLCALC 95 07/08/2015   ALT 9 06/13/2019   AST 22 06/13/2019   NA 141 06/13/2019   K 3.9 06/13/2019   CL 104 06/13/2019   CREATININE  0.88 06/13/2019   BUN 23 06/13/2019   CO2 31 06/13/2019   TSH 3.96 09/07/2018    MR BRAIN WO CONTRAST  Result Date: 10/04/2017 CLINICAL DATA:  Progressive memory loss since February of this year. EXAM: MRI HEAD WITHOUT CONTRAST TECHNIQUE: Multiplanar, multiecho pulse sequences of the brain and surrounding structures were obtained without intravenous contrast. COMPARISON:  None. FINDINGS: Brain: Diffusion  imaging does not show any acute or subacute infarction. Mild chronic small-vessel change of the pons. No focal cerebellar insult. Cerebral hemispheres show age related atrophy with mild to moderate chronic small-vessel ischemic changes of the white matter. Atrophy is slightly temporal lobe predominant. No cortical or large vessel territory infarction. No mass lesion, hemorrhage, hydrocephalus or extra-axial collection. Vascular: Major vessels at the base of the brain show flow. Skull and upper cervical spine: Negative Sinuses/Orbits: Clear/normal Other: None IMPRESSION: No acute or reversible finding. Brain atrophy, somewhat temporal lobe predominant. Mild to moderate chronic small-vessel ischemic changes of the cerebral hemispheric white matter. Electronically Signed   By: Paulina Fusi M.D.   On: 10/04/2017 12:50    Assessment & Plan:   Problem List Items Addressed This Visit      Unprioritized   Dementia, vascular, mixed, without behavioral disturbance (HCC)    Per her POA she has become increasingly confused and intermittently frustrated at times without being combative.  She has not had any falls , no longer drives,  And continues to live alone with daily visitations by church family to provide meals . She is opposed to changing clothes frequently but her skin shows no signs of poor hygiene or ulcerations. At last visit I recommended transitioning to A/L facility,  But she has received increased  care at home from a paid caregiver and is happy at home.          We will continue to administer pneumococcal 23 valent vaccine.  No orders of the defined types were placed in this encounter.   There are no discontinued medications.  Follow-up: No follow-ups on file.   Sherlene Shams, MD

## 2019-12-21 NOTE — Patient Instructions (Signed)
[place],  You are in good shape !  But please don't lose any more weight!

## 2019-12-23 NOTE — Assessment & Plan Note (Addendum)
Per her POA she has become increasingly confused and intermittently frustrated at times without being combative.  She has not had any falls , no longer drives,  And continues to live alone with daily visitations by church family to provide meals . She is opposed to changing clothes frequently but her skin shows no signs of poor hygiene or ulcerations. At last visit I recommended transitioning to A/L facility,  But she has received increased  care at home from a paid caregiver and is happy at home.

## 2020-01-11 ENCOUNTER — Telehealth: Payer: Self-pay | Admitting: Internal Medicine

## 2020-01-11 NOTE — Telephone Encounter (Signed)
Harvie Heck called wanting to know if Dr. Darrick Huntsman wanted patient to get the Pfizer covid booster since she got the JJ the first time

## 2020-01-11 NOTE — Telephone Encounter (Signed)
YES. SHE SHOULD GET THE PFIZER

## 2020-01-11 NOTE — Telephone Encounter (Signed)
LMTCB

## 2020-01-15 NOTE — Telephone Encounter (Signed)
Spoke with Harvie Heck to let him know that it is okay for pt to get the [employer] booster.

## 2020-04-16 ENCOUNTER — Telehealth: Payer: Self-pay | Admitting: Internal Medicine

## 2020-04-16 DIAGNOSIS — H9113 Presbycusis, bilateral: Secondary | ICD-10-CM

## 2020-04-16 NOTE — Telephone Encounter (Signed)
POA called and wants a referral placed to  ENT for her hearing test so insurance will cover the visit  Pt has an appt there on 04/23/20

## 2020-04-16 NOTE — Addendum Note (Signed)
Addended by: Sherlene Shams on: 04/16/2020 03:31 PM   Modules accepted: Orders

## 2020-06-16 ENCOUNTER — Telehealth: Payer: Self-pay | Admitting: Internal Medicine

## 2020-06-16 DIAGNOSIS — Z111 Encounter for screening for respiratory tuberculosis: Secondary | ICD-10-CM

## 2020-06-16 NOTE — Telephone Encounter (Signed)
Randall, the POA. Patient will be going to day activities at [name] and they require a TB test. Patient will come into office next week to have done.

## 2020-06-17 ENCOUNTER — Telehealth: Payer: Self-pay | Admitting: Internal Medicine

## 2020-06-17 ENCOUNTER — Other Ambulatory Visit: Payer: Self-pay

## 2020-06-17 ENCOUNTER — Ambulatory Visit (INDEPENDENT_AMBULATORY_CARE_PROVIDER_SITE_OTHER): Payer: PPO

## 2020-06-17 DIAGNOSIS — Z111 Encounter for screening for respiratory tuberculosis: Secondary | ICD-10-CM | POA: Diagnosis not present

## 2020-06-17 NOTE — Telephone Encounter (Signed)
Patient in office today for Nurse visit Left application for [facility] facility, forms need to be completed by PCP. Patient coming in for PPD read on 06/19/20

## 2020-06-17 NOTE — Progress Notes (Signed)
Patient presented for PPD placement to left arm, patient voiced no concerns nor showed any signs of distress during injection.

## 2020-06-17 NOTE — Telephone Encounter (Signed)
PPD ordered .  Please make sure it is placed on a Mon.  Tues or Wed ,  bc it must be read at 48 hours.

## 2020-06-18 NOTE — Telephone Encounter (Signed)
Received holding for appt tomorrow.

## 2020-06-19 ENCOUNTER — Other Ambulatory Visit: Payer: Self-pay

## 2020-06-19 ENCOUNTER — Ambulatory Visit (INDEPENDENT_AMBULATORY_CARE_PROVIDER_SITE_OTHER): Payer: PPO

## 2020-06-19 DIAGNOSIS — Z111 Encounter for screening for respiratory tuberculosis: Secondary | ICD-10-CM | POA: Diagnosis not present

## 2020-06-19 LAB — TB SKIN TEST
Induration: 0 mm
TB Skin Test: NEGATIVE

## 2020-06-19 NOTE — Telephone Encounter (Signed)
Forms completed by MD  ,  Awaiting results of PPD>  Returned to you in [employer]

## 2020-06-19 NOTE — Progress Notes (Signed)
Patient reported into clinic for PPD Read.  Results where Negative/0MM.

## 2020-06-19 NOTE — Telephone Encounter (Signed)
Placed in red folder for completion.  

## 2020-06-19 NOTE — Telephone Encounter (Signed)
PPD has been read by Mal Amabile, CMA. Form has been completed and given to pt care giver. Copy has been made and placed to be scanned.

## 2020-09-10 ENCOUNTER — Ambulatory Visit: Payer: PPO

## 2020-09-23 ENCOUNTER — Other Ambulatory Visit: Payer: Self-pay

## 2020-09-23 ENCOUNTER — Ambulatory Visit (INDEPENDENT_AMBULATORY_CARE_PROVIDER_SITE_OTHER): Payer: PPO | Admitting: Internal Medicine

## 2020-09-23 ENCOUNTER — Encounter: Payer: Self-pay | Admitting: Internal Medicine

## 2020-09-23 DIAGNOSIS — F0150 Vascular dementia without behavioral disturbance: Secondary | ICD-10-CM | POA: Diagnosis not present

## 2020-09-23 NOTE — Progress Notes (Signed)
Subjective:  Patient ID: Courtney Newton, female    DOB: 1927-02-11  Age: 87 y.o. MRN: [id number]  CC: The encounter diagnosis was Dementia, vascular, mixed, without behavioral disturbance (HCC).  HPI Courtney Newton presents for follow up on vascular dementia with visual hallucinations.  This visit occurred during the SARS-CoV-2 public health emergency.  Safety protocols were in place, including screening questions prior to the visit, additional usage of staff PPE, and extensive cleaning of exam room while observing appropriate contact time as indicated for disinfecting solutions.   Courtney Newton is accompanied by Mikeal Hawthorne, who has been supervising and coordinating her care for the last 2 years.  He provides her history today, as she has no memory of recent events.  She has  become increasingly dependent on his companionship and often goes searching for him when he leaves her home.  She requires the assistance of the aides who now provide 24 hour supervision to ensure that she is eating and maintaining proper hygiene.  She has become increasingly belligerent. She is incontinent of feces but not of urine. [name] has been looking for a memory care facility and has a bed offer from a facility called [facility] in [place] ,  [place] and needs and FL-2   She had a fall at home several weeks ago and scraped the skin off of her left forearm/elbow .  The wound is healing well and was examined today    No outpatient medications prior to visit.   Facility-Administered Medications Prior to Visit  Medication Dose Route Frequency Provider Last Rate Last Admin   pneumococcal 23 valent vaccine (PNU-IMMUNE) injection 0.5 mL  0.5 mL Intramuscular Once Sherlene Shams, MD        Review of Systems;  Patient denies headache, fevers, malaise, unintentional weight loss, skin rash, eye pain, sinus congestion and sinus pain, sore throat, dysphagia,  hemoptysis , cough, dyspnea, wheezing, chest pain,  palpitations, orthopnea, edema, abdominal pain, nausea, melena, diarrhea, constipation, flank pain, dysuria, hematuria, urinary  Frequency, nocturia, numbness, tingling, seizures,  Focal weakness, Loss of consciousness,  Tremor, insomnia, depression, anxiety, and suicidal ideation.      Objective:  BP 108/60   Pulse 76   Temp 98.3 F (36.8 C)   Ht 5' 2.99" (1.6 m)   Wt 94 lb 12.8 oz (43 kg)   SpO2 93%   BMI 16.80 kg/m   BP Readings from Last 3 Encounters:  09/23/20 108/60  12/21/19 136/68  09/25/19 (!) 142/80    Wt Readings from Last 3 Encounters:  09/23/20 94 lb 12.8 oz (43 kg)  12/21/19 95 lb 3.2 oz (43.2 kg)  09/25/19 101 lb 3.2 oz (45.9 kg)    General appearance: alert, cooperative and appears stated age Neck: no adenopathy, no carotid bruit, supple, symmetrical, trachea midline and thyroid not enlarged, symmetric, no tenderness/mass/nodules Back: symmetric, no curvature. ROM normal. No CVA tenderness. Lungs: clear to auscultation bilaterally Heart: regular rate and rhythm, S1, S2 normal, no murmur, click, rub or gallop Abdomen: soft, non-tender; bowel sounds normal; no masses,  no organomegaly Pulses: 2+ and symmetric Skin: left lateral forearm/elbow with superficial wound , no signs of infection  Psych/Neurologic:  pleasant, jovial,  verbally responsive but unable to provide history    No results found for: HGBA1C  Lab Results  Component Value Date   CREATININE 0.88 06/13/2019   CREATININE 0.79 09/07/2018   CREATININE 0.89 02/06/2018    Lab Results  Component Value Date  WBC 7.3 09/07/2018   HGB 13.3 09/07/2018   HCT 39.7 09/07/2018   PLT 201.0 09/07/2018   GLUCOSE 98 06/13/2019   CHOL 151 07/08/2015   TRIG 67 07/08/2015   HDL 43 07/08/2015   LDLDIRECT 137.8 04/06/2012   LDLCALC 95 07/08/2015   ALT 9 06/13/2019   AST 22 06/13/2019   NA 141 06/13/2019   K 3.9 06/13/2019   CL 104 06/13/2019   CREATININE 0.88 06/13/2019   BUN 23 06/13/2019   CO2  31 06/13/2019   TSH 3.96 09/07/2018     Assessment & Plan:   Problem List Items Addressed This Visit       Unprioritized   Dementia, vascular, mixed, without behavioral disturbance (HCC)    Progressive,  With history of recurrent non frightening visual hallucinations.  She now requires  24 hour supervision to maintain her in a safe environment and  support her personal hygiene and nutrition.  Agree with placement .  FL-2 signed         We will continue to administer pneumococcal 23 valent vaccine.  No orders of the defined types were placed in this encounter.   There are no discontinued medications.  Follow-up: No follow-ups on file.   Sherlene Shams, MD

## 2020-09-24 ENCOUNTER — Telehealth: Payer: Self-pay

## 2020-09-24 NOTE — Assessment & Plan Note (Addendum)
Progressive,  With history of recurrent non frightening visual hallucinations.  She now requires  24 hour supervision to maintain her in a safe environment and  support her personal hygiene and nutrition.  Agree with placement .  FL-2 signed

## 2020-09-24 NOTE — Telephone Encounter (Signed)
Spoke with pt's POA and he stated that the facility pt will be moving to needs her office note from yesterday. Once note has been signed I will fax.   Fax: [phone] Email: [email or web address]

## 2020-09-24 NOTE — Telephone Encounter (Signed)
[place] POA called and needs to talk with you about FL2 form. His number is [phone]

## 2020-09-25 NOTE — Telephone Encounter (Signed)
Office note has been faxed to [facility] per [facility] request.

## 2020-10-06 ENCOUNTER — Telehealth: Payer: Self-pay | Admitting: Internal Medicine

## 2020-10-06 NOTE — Telephone Encounter (Signed)
noted 

## 2020-10-06 NOTE — Telephone Encounter (Signed)
The wrong Fl1 was filled out, she needs long term. Someone from the family will drop off the one that was filled out along with the correct FL2 to be completed.

## 2020-10-06 NOTE — Telephone Encounter (Signed)
PT family member dropped off form. Its in the folder up front for Dr.Tullo

## 2020-10-08 NOTE — Telephone Encounter (Signed)
FL2 form has ben completed. Called Harvie Heck to let him know, he stated that he may just stop by this afternoon any get it if not he will in the morning.

## 2020-10-08 NOTE — Telephone Encounter (Signed)
Harvie Heck wants to come by in the morning to pickup.

## 2020-10-09 ENCOUNTER — Ambulatory Visit: Payer: PPO | Admitting: Internal Medicine

## 2020-10-14 NOTE — Telephone Encounter (Signed)
Printed and faxed

## 2020-10-14 NOTE — Telephone Encounter (Signed)
[facility] called they need pt demographics faxed to them to complete the state required paper work  Fax- [phone]

## 2020-10-22 ENCOUNTER — Telehealth: Payer: Self-pay

## 2020-10-22 DIAGNOSIS — R262 Difficulty in walking, not elsewhere classified: Secondary | ICD-10-CM | POA: Diagnosis not present

## 2020-10-22 DIAGNOSIS — R278 Other lack of coordination: Secondary | ICD-10-CM | POA: Diagnosis not present

## 2020-10-22 DIAGNOSIS — M6281 Muscle weakness (generalized): Secondary | ICD-10-CM | POA: Diagnosis not present

## 2020-10-22 NOTE — Telephone Encounter (Signed)
FYI- Received a call from [employer], RN, at [facility] stating that they are receiving Courtney Newton and asked about the patients Allergies. Informed mary that the only allergy viewable from our system was Lovastatin. Mary verbalized understanding and had no other questions.

## 2020-10-27 DIAGNOSIS — R636 Underweight: Secondary | ICD-10-CM | POA: Diagnosis not present

## 2020-10-27 DIAGNOSIS — R441 Visual hallucinations: Secondary | ICD-10-CM | POA: Diagnosis not present

## 2020-10-27 DIAGNOSIS — M810 Age-related osteoporosis without current pathological fracture: Secondary | ICD-10-CM | POA: Diagnosis not present

## 2020-10-27 DIAGNOSIS — F0150 Vascular dementia without behavioral disturbance: Secondary | ICD-10-CM | POA: Diagnosis not present

## 2020-10-29 DIAGNOSIS — M810 Age-related osteoporosis without current pathological fracture: Secondary | ICD-10-CM | POA: Diagnosis not present

## 2020-10-29 DIAGNOSIS — R441 Visual hallucinations: Secondary | ICD-10-CM | POA: Diagnosis not present

## 2020-10-29 DIAGNOSIS — F0150 Vascular dementia without behavioral disturbance: Secondary | ICD-10-CM | POA: Diagnosis not present

## 2020-10-29 DIAGNOSIS — F0151 Vascular dementia with behavioral disturbance: Secondary | ICD-10-CM | POA: Diagnosis not present

## 2020-11-16 DIAGNOSIS — R4182 Altered mental status, unspecified: Secondary | ICD-10-CM | POA: Diagnosis not present

## 2020-11-16 DIAGNOSIS — S01112A Laceration without foreign body of left eyelid and periocular area, initial encounter: Secondary | ICD-10-CM | POA: Diagnosis not present

## 2020-11-16 DIAGNOSIS — Z743 Need for continuous supervision: Secondary | ICD-10-CM | POA: Diagnosis not present

## 2020-11-16 DIAGNOSIS — R296 Repeated falls: Secondary | ICD-10-CM | POA: Diagnosis not present

## 2020-11-16 DIAGNOSIS — S0181XA Laceration without foreign body of other part of head, initial encounter: Secondary | ICD-10-CM | POA: Diagnosis not present

## 2020-11-16 DIAGNOSIS — S52572A Other intraarticular fracture of lower end of left radius, initial encounter for closed fracture: Secondary | ICD-10-CM | POA: Diagnosis not present

## 2020-11-16 DIAGNOSIS — G8911 Acute pain due to trauma: Secondary | ICD-10-CM | POA: Diagnosis not present

## 2020-11-16 DIAGNOSIS — S0990XA Unspecified injury of head, initial encounter: Secondary | ICD-10-CM | POA: Diagnosis not present

## 2020-11-16 DIAGNOSIS — F0390 Unspecified dementia without behavioral disturbance: Secondary | ICD-10-CM | POA: Diagnosis not present

## 2020-11-16 DIAGNOSIS — Z888 Allergy status to other drugs, medicaments and biological substances status: Secondary | ICD-10-CM | POA: Diagnosis not present

## 2020-11-16 DIAGNOSIS — W19XXXA Unspecified fall, initial encounter: Secondary | ICD-10-CM | POA: Diagnosis not present

## 2020-11-16 DIAGNOSIS — S52502A Unspecified fracture of the lower end of left radius, initial encounter for closed fracture: Secondary | ICD-10-CM | POA: Diagnosis not present

## 2020-11-17 DIAGNOSIS — S0181XA Laceration without foreign body of other part of head, initial encounter: Secondary | ICD-10-CM | POA: Diagnosis not present

## 2020-11-17 DIAGNOSIS — S52509A Unspecified fracture of the lower end of unspecified radius, initial encounter for closed fracture: Secondary | ICD-10-CM | POA: Diagnosis not present

## 2020-11-17 DIAGNOSIS — W19XXXA Unspecified fall, initial encounter: Secondary | ICD-10-CM | POA: Diagnosis not present

## 2020-11-17 DIAGNOSIS — S0083XA Contusion of other part of head, initial encounter: Secondary | ICD-10-CM | POA: Diagnosis not present

## 2020-11-25 DIAGNOSIS — M25532 Pain in left wrist: Secondary | ICD-10-CM | POA: Diagnosis not present

## 2020-11-25 DIAGNOSIS — S52532A Colles' fracture of left radius, initial encounter for closed fracture: Secondary | ICD-10-CM | POA: Diagnosis not present

## 2020-11-28 DIAGNOSIS — F0151 Vascular dementia with behavioral disturbance: Secondary | ICD-10-CM | POA: Diagnosis not present

## 2020-11-28 DIAGNOSIS — S52509A Unspecified fracture of the lower end of unspecified radius, initial encounter for closed fracture: Secondary | ICD-10-CM | POA: Diagnosis not present

## 2020-12-02 DIAGNOSIS — M25532 Pain in left wrist: Secondary | ICD-10-CM | POA: Diagnosis not present

## 2020-12-02 DIAGNOSIS — S52532A Colles' fracture of left radius, initial encounter for closed fracture: Secondary | ICD-10-CM | POA: Diagnosis not present

## 2020-12-09 DIAGNOSIS — F0151 Vascular dementia with behavioral disturbance: Secondary | ICD-10-CM | POA: Diagnosis not present

## 2020-12-09 DIAGNOSIS — S52502S Unspecified fracture of the lower end of left radius, sequela: Secondary | ICD-10-CM | POA: Diagnosis not present

## 2020-12-23 DIAGNOSIS — B351 Tinea unguium: Secondary | ICD-10-CM | POA: Diagnosis not present

## 2020-12-23 DIAGNOSIS — Q6689 Other  specified congenital deformities of feet: Secondary | ICD-10-CM | POA: Diagnosis not present

## 2021-01-06 DIAGNOSIS — S52532A Colles' fracture of left radius, initial encounter for closed fracture: Secondary | ICD-10-CM | POA: Diagnosis not present

## 2021-01-06 DIAGNOSIS — M25532 Pain in left wrist: Secondary | ICD-10-CM | POA: Diagnosis not present

## 2021-02-04 DIAGNOSIS — S52502S Unspecified fracture of the lower end of left radius, sequela: Secondary | ICD-10-CM | POA: Diagnosis not present

## 2021-02-04 DIAGNOSIS — F0390 Unspecified dementia without behavioral disturbance: Secondary | ICD-10-CM | POA: Diagnosis not present

## 2021-02-10 DIAGNOSIS — F0390 Unspecified dementia without behavioral disturbance: Secondary | ICD-10-CM | POA: Diagnosis not present

## 2021-02-10 DIAGNOSIS — S52502S Unspecified fracture of the lower end of left radius, sequela: Secondary | ICD-10-CM | POA: Diagnosis not present

## 2021-02-17 ENCOUNTER — Telehealth: Payer: Self-pay | Admitting: Internal Medicine

## 2021-02-17 DIAGNOSIS — S52532A Colles' fracture of left radius, initial encounter for closed fracture: Secondary | ICD-10-CM | POA: Diagnosis not present

## 2021-02-17 DIAGNOSIS — M25532 Pain in left wrist: Secondary | ICD-10-CM | POA: Diagnosis not present

## 2021-02-17 NOTE — Telephone Encounter (Signed)
Patient due for Prolia 03/08/21 filed approval on [place]

## 2021-03-02 NOTE — Telephone Encounter (Signed)
No longer receiving Prolia accord to DPR friend Or[place]. Patientin long term care.

## 2021-04-01 DIAGNOSIS — M810 Age-related osteoporosis without current pathological fracture: Secondary | ICD-10-CM | POA: Diagnosis not present

## 2021-04-01 DIAGNOSIS — R636 Underweight: Secondary | ICD-10-CM | POA: Diagnosis not present

## 2021-04-01 DIAGNOSIS — F0390 Unspecified dementia without behavioral disturbance: Secondary | ICD-10-CM | POA: Diagnosis not present

## 2021-04-01 DIAGNOSIS — R441 Visual hallucinations: Secondary | ICD-10-CM | POA: Diagnosis not present

## 2021-05-05 DIAGNOSIS — F0390 Unspecified dementia without behavioral disturbance: Secondary | ICD-10-CM | POA: Diagnosis not present

## 2021-05-05 DIAGNOSIS — F419 Anxiety disorder, unspecified: Secondary | ICD-10-CM | POA: Diagnosis not present

## 2021-05-05 DIAGNOSIS — Z0000 Encounter for general adult medical examination without abnormal findings: Secondary | ICD-10-CM | POA: Diagnosis not present

## 2021-05-07 DIAGNOSIS — Z0189 Encounter for other specified special examinations: Secondary | ICD-10-CM | POA: Diagnosis not present

## 2021-05-14 DIAGNOSIS — F0150 Vascular dementia without behavioral disturbance: Secondary | ICD-10-CM | POA: Diagnosis not present

## 2021-05-14 DIAGNOSIS — M810 Age-related osteoporosis without current pathological fracture: Secondary | ICD-10-CM | POA: Diagnosis not present

## 2021-05-14 DIAGNOSIS — F419 Anxiety disorder, unspecified: Secondary | ICD-10-CM | POA: Diagnosis not present

## 2021-05-22 DIAGNOSIS — E559 Vitamin D deficiency, unspecified: Secondary | ICD-10-CM | POA: Diagnosis not present

## 2021-05-22 DIAGNOSIS — D72829 Elevated white blood cell count, unspecified: Secondary | ICD-10-CM | POA: Diagnosis not present

## 2021-05-22 DIAGNOSIS — R7989 Other specified abnormal findings of blood chemistry: Secondary | ICD-10-CM | POA: Diagnosis not present

## 2021-07-21 DIAGNOSIS — Z681 Body mass index (BMI) 19 or less, adult: Secondary | ICD-10-CM | POA: Diagnosis not present

## 2021-07-21 DIAGNOSIS — F0390 Unspecified dementia without behavioral disturbance: Secondary | ICD-10-CM | POA: Diagnosis not present

## 2021-07-21 DIAGNOSIS — R636 Underweight: Secondary | ICD-10-CM | POA: Diagnosis not present

## 2021-07-21 DIAGNOSIS — R441 Visual hallucinations: Secondary | ICD-10-CM | POA: Diagnosis not present

## 2021-09-18 DIAGNOSIS — Z681 Body mass index (BMI) 19 or less, adult: Secondary | ICD-10-CM | POA: Diagnosis not present

## 2021-09-18 DIAGNOSIS — F0390 Unspecified dementia without behavioral disturbance: Secondary | ICD-10-CM | POA: Diagnosis not present

## 2021-09-18 DIAGNOSIS — M810 Age-related osteoporosis without current pathological fracture: Secondary | ICD-10-CM | POA: Diagnosis not present

## 2021-10-06 ENCOUNTER — Telehealth: Payer: Self-pay

## 2021-10-06 NOTE — Telephone Encounter (Signed)
I attempted to call patient to schedule follow-up. Was informed that patient no longer seeing Dr. Lauretta Grill as she is in a memory care unit & follows with physician there now. Removed Dr. Darrick Huntsman as PCP.

## 2021-11-25 DIAGNOSIS — M810 Age-related osteoporosis without current pathological fracture: Secondary | ICD-10-CM | POA: Diagnosis not present

## 2021-11-25 DIAGNOSIS — Z681 Body mass index (BMI) 19 or less, adult: Secondary | ICD-10-CM | POA: Diagnosis not present

## 2021-11-25 DIAGNOSIS — F03911 Unspecified dementia, unspecified severity, with agitation: Secondary | ICD-10-CM | POA: Diagnosis not present

## 2021-11-25 DIAGNOSIS — R636 Underweight: Secondary | ICD-10-CM | POA: Diagnosis not present

## 2022-01-29 DIAGNOSIS — F0390 Unspecified dementia without behavioral disturbance: Secondary | ICD-10-CM | POA: Diagnosis not present

## 2022-01-29 DIAGNOSIS — Z681 Body mass index (BMI) 19 or less, adult: Secondary | ICD-10-CM | POA: Diagnosis not present

## 2022-03-24 DIAGNOSIS — F0392 Unspecified dementia, unspecified severity, with psychotic disturbance: Secondary | ICD-10-CM | POA: Diagnosis not present

## 2022-03-24 DIAGNOSIS — R636 Underweight: Secondary | ICD-10-CM | POA: Diagnosis not present

## 2022-03-24 DIAGNOSIS — F03911 Unspecified dementia, unspecified severity, with agitation: Secondary | ICD-10-CM | POA: Diagnosis not present

## 2022-03-24 DIAGNOSIS — Z681 Body mass index (BMI) 19 or less, adult: Secondary | ICD-10-CM | POA: Diagnosis not present

## 2022-05-26 DIAGNOSIS — Z681 Body mass index (BMI) 19 or less, adult: Secondary | ICD-10-CM | POA: Diagnosis not present

## 2022-05-26 DIAGNOSIS — R636 Underweight: Secondary | ICD-10-CM | POA: Diagnosis not present

## 2022-05-26 DIAGNOSIS — M810 Age-related osteoporosis without current pathological fracture: Secondary | ICD-10-CM | POA: Diagnosis not present

## 2022-05-26 DIAGNOSIS — F03911 Unspecified dementia, unspecified severity, with agitation: Secondary | ICD-10-CM | POA: Diagnosis not present

## 2022-07-21 DIAGNOSIS — F03911 Unspecified dementia, unspecified severity, with agitation: Secondary | ICD-10-CM | POA: Diagnosis not present

## 2022-07-21 DIAGNOSIS — M810 Age-related osteoporosis without current pathological fracture: Secondary | ICD-10-CM | POA: Diagnosis not present

## 2022-07-21 DIAGNOSIS — Z681 Body mass index (BMI) 19 or less, adult: Secondary | ICD-10-CM | POA: Diagnosis not present

## 2022-07-21 DIAGNOSIS — R636 Underweight: Secondary | ICD-10-CM | POA: Diagnosis not present

## 2022-09-15 DIAGNOSIS — F03911 Unspecified dementia, unspecified severity, with agitation: Secondary | ICD-10-CM | POA: Diagnosis not present

## 2022-09-15 DIAGNOSIS — M810 Age-related osteoporosis without current pathological fracture: Secondary | ICD-10-CM | POA: Diagnosis not present

## 2022-09-15 DIAGNOSIS — R636 Underweight: Secondary | ICD-10-CM | POA: Diagnosis not present

## 2022-09-15 DIAGNOSIS — Z681 Body mass index (BMI) 19 or less, adult: Secondary | ICD-10-CM | POA: Diagnosis not present

## 2022-09-20 DIAGNOSIS — E569 Vitamin deficiency, unspecified: Secondary | ICD-10-CM | POA: Diagnosis not present

## 2022-11-24 DIAGNOSIS — F03911 Unspecified dementia, unspecified severity, with agitation: Secondary | ICD-10-CM | POA: Diagnosis not present

## 2022-11-24 DIAGNOSIS — R636 Underweight: Secondary | ICD-10-CM | POA: Diagnosis not present

## 2022-11-24 DIAGNOSIS — M810 Age-related osteoporosis without current pathological fracture: Secondary | ICD-10-CM | POA: Diagnosis not present

## 2022-11-24 DIAGNOSIS — F0392 Unspecified dementia, unspecified severity, with psychotic disturbance: Secondary | ICD-10-CM | POA: Diagnosis not present

## 2023-01-07 DIAGNOSIS — F0150 Vascular dementia without behavioral disturbance: Secondary | ICD-10-CM | POA: Diagnosis not present

## 2023-01-07 DIAGNOSIS — K921 Melena: Secondary | ICD-10-CM | POA: Diagnosis not present

## 2023-01-07 DIAGNOSIS — K5900 Constipation, unspecified: Secondary | ICD-10-CM | POA: Diagnosis not present

## 2023-01-28 DIAGNOSIS — F03911 Unspecified dementia, unspecified severity, with agitation: Secondary | ICD-10-CM | POA: Diagnosis not present

## 2023-01-28 DIAGNOSIS — R636 Underweight: Secondary | ICD-10-CM | POA: Diagnosis not present

## 2023-01-28 DIAGNOSIS — F0392 Unspecified dementia, unspecified severity, with psychotic disturbance: Secondary | ICD-10-CM | POA: Diagnosis not present

## 2023-03-29 DIAGNOSIS — F0392 Unspecified dementia, unspecified severity, with psychotic disturbance: Secondary | ICD-10-CM | POA: Diagnosis not present

## 2023-03-29 DIAGNOSIS — R636 Underweight: Secondary | ICD-10-CM | POA: Diagnosis not present

## 2023-03-29 DIAGNOSIS — F03911 Unspecified dementia, unspecified severity, with agitation: Secondary | ICD-10-CM | POA: Diagnosis not present

## 2023-05-23 DIAGNOSIS — R636 Underweight: Secondary | ICD-10-CM | POA: Diagnosis not present

## 2023-05-23 DIAGNOSIS — F0392 Unspecified dementia, unspecified severity, with psychotic disturbance: Secondary | ICD-10-CM | POA: Diagnosis not present

## 2023-05-23 DIAGNOSIS — F03911 Unspecified dementia, unspecified severity, with agitation: Secondary | ICD-10-CM | POA: Diagnosis not present

## 2023-07-01 DIAGNOSIS — L989 Disorder of the skin and subcutaneous tissue, unspecified: Secondary | ICD-10-CM | POA: Diagnosis not present

## 2023-07-01 DIAGNOSIS — F03911 Unspecified dementia, unspecified severity, with agitation: Secondary | ICD-10-CM | POA: Diagnosis not present

## 2023-07-01 DIAGNOSIS — F0392 Unspecified dementia, unspecified severity, with psychotic disturbance: Secondary | ICD-10-CM | POA: Diagnosis not present

## 2023-07-20 DIAGNOSIS — M810 Age-related osteoporosis without current pathological fracture: Secondary | ICD-10-CM | POA: Diagnosis not present

## 2023-07-20 DIAGNOSIS — R441 Visual hallucinations: Secondary | ICD-10-CM | POA: Diagnosis not present

## 2023-07-20 DIAGNOSIS — F03911 Unspecified dementia, unspecified severity, with agitation: Secondary | ICD-10-CM | POA: Diagnosis not present

## 2023-07-20 DIAGNOSIS — F0392 Unspecified dementia, unspecified severity, with psychotic disturbance: Secondary | ICD-10-CM | POA: Diagnosis not present

## 2023-08-19 DIAGNOSIS — M79622 Pain in left upper arm: Secondary | ICD-10-CM | POA: Diagnosis not present

## 2023-08-19 DIAGNOSIS — S40022A Contusion of left upper arm, initial encounter: Secondary | ICD-10-CM | POA: Diagnosis not present

## 2023-09-07 DIAGNOSIS — F03911 Unspecified dementia, unspecified severity, with agitation: Secondary | ICD-10-CM | POA: Diagnosis not present

## 2023-09-07 DIAGNOSIS — M810 Age-related osteoporosis without current pathological fracture: Secondary | ICD-10-CM | POA: Diagnosis not present

## 2023-09-07 DIAGNOSIS — Z66 Do not resuscitate: Secondary | ICD-10-CM | POA: Diagnosis not present

## 2023-09-07 DIAGNOSIS — R636 Underweight: Secondary | ICD-10-CM | POA: Diagnosis not present

## 2023-09-19 DIAGNOSIS — S41111A Laceration without foreign body of right upper arm, initial encounter: Secondary | ICD-10-CM | POA: Diagnosis not present

## 2023-09-19 DIAGNOSIS — S41112A Laceration without foreign body of left upper arm, initial encounter: Secondary | ICD-10-CM | POA: Diagnosis not present

## 2023-09-19 DIAGNOSIS — M810 Age-related osteoporosis without current pathological fracture: Secondary | ICD-10-CM | POA: Diagnosis not present

## 2023-09-19 DIAGNOSIS — F03911 Unspecified dementia, unspecified severity, with agitation: Secondary | ICD-10-CM | POA: Diagnosis not present

## 2023-10-27 DIAGNOSIS — E559 Vitamin D deficiency, unspecified: Secondary | ICD-10-CM | POA: Diagnosis not present

## 2023-11-14 DIAGNOSIS — S51812A Laceration without foreign body of left forearm, initial encounter: Secondary | ICD-10-CM | POA: Diagnosis not present

## 2023-12-20 DIAGNOSIS — S51812A Laceration without foreign body of left forearm, initial encounter: Secondary | ICD-10-CM | POA: Diagnosis not present

## 2023-12-20 DIAGNOSIS — F0392 Unspecified dementia, unspecified severity, with psychotic disturbance: Secondary | ICD-10-CM | POA: Diagnosis not present

## 2023-12-20 DIAGNOSIS — R636 Underweight: Secondary | ICD-10-CM | POA: Diagnosis not present

## 2024-01-04 DIAGNOSIS — L309 Dermatitis, unspecified: Secondary | ICD-10-CM | POA: Diagnosis not present

## 2024-01-18 DIAGNOSIS — M810 Age-related osteoporosis without current pathological fracture: Secondary | ICD-10-CM | POA: Diagnosis not present

## 2024-01-18 DIAGNOSIS — R636 Underweight: Secondary | ICD-10-CM | POA: Diagnosis not present

## 2024-01-18 DIAGNOSIS — F0392 Unspecified dementia, unspecified severity, with psychotic disturbance: Secondary | ICD-10-CM | POA: Diagnosis not present

## 2024-01-25 DIAGNOSIS — H1033 Unspecified acute conjunctivitis, bilateral: Secondary | ICD-10-CM | POA: Diagnosis not present

## 2024-02-13 DIAGNOSIS — Z7189 Other specified counseling: Secondary | ICD-10-CM | POA: Diagnosis not present

## 2024-02-13 DIAGNOSIS — Z515 Encounter for palliative care: Secondary | ICD-10-CM | POA: Diagnosis not present

## 2024-02-13 DIAGNOSIS — R4189 Other symptoms and signs involving cognitive functions and awareness: Secondary | ICD-10-CM | POA: Diagnosis not present

## 2024-02-20 DEATH — deceased
